# Patient Record
Sex: Female | Born: 1985 | Race: Black or African American | Hispanic: No | Marital: Single | Smoking: Current every day smoker
Health system: Southern US, Community
[De-identification: ages and names within clinical notes are randomized; demographics above are authoritative.]

## PROBLEM LIST (undated history)

## (undated) DIAGNOSIS — K219 Gastro-esophageal reflux disease without esophagitis: Secondary | ICD-10-CM

## (undated) HISTORY — PX: TONSILLECTOMY: SUR1361

## (undated) HISTORY — DX: Gastro-esophageal reflux disease without esophagitis: K21.9

---

## 2013-10-13 ENCOUNTER — Encounter (HOSPITAL_COMMUNITY): Payer: Self-pay | Admitting: Emergency Medicine

## 2013-10-13 ENCOUNTER — Emergency Department (HOSPITAL_COMMUNITY): Payer: Medicaid Other

## 2013-10-13 ENCOUNTER — Emergency Department (HOSPITAL_COMMUNITY)
Admission: EM | Admit: 2013-10-13 | Discharge: 2013-10-13 | Disposition: A | Discharge status: Home / Self Care | Payer: Medicaid Other | Attending: Emergency Medicine | Admitting: Emergency Medicine

## 2013-10-13 DIAGNOSIS — Y9389 Activity, other specified: Secondary | ICD-10-CM | POA: Insufficient documentation

## 2013-10-13 DIAGNOSIS — Z880 Allergy status to penicillin: Secondary | ICD-10-CM | POA: Insufficient documentation

## 2013-10-13 DIAGNOSIS — F17200 Nicotine dependence, unspecified, uncomplicated: Secondary | ICD-10-CM | POA: Insufficient documentation

## 2013-10-13 DIAGNOSIS — S91109A Unspecified open wound of unspecified toe(s) without damage to nail, initial encounter: Secondary | ICD-10-CM | POA: Insufficient documentation

## 2013-10-13 DIAGNOSIS — Y929 Unspecified place or not applicable: Secondary | ICD-10-CM | POA: Insufficient documentation

## 2013-10-13 DIAGNOSIS — IMO0002 Reserved for concepts with insufficient information to code with codable children: Secondary | ICD-10-CM | POA: Insufficient documentation

## 2013-10-13 DIAGNOSIS — S91209A Unspecified open wound of unspecified toe(s) with damage to nail, initial encounter: Secondary | ICD-10-CM

## 2013-10-13 MED ORDER — HYDROCODONE-ACETAMINOPHEN 5-325 MG PO TABS: 1.0000 | ORAL_TABLET | Freq: Four times a day (QID) | ORAL | Status: DC | PRN

## 2013-10-13 NOTE — Discharge Instructions (Signed)
Nail Avulsion Injury Nail avulsion means that you have lost the whole, or part of a nail. The nail will usually grow back in 2 to 6 months. If your injury damaged the growth center of the nail, the nail may be deformed, split, or not stuck to the nail bed. Sometimes the avulsed nail is stitched back in place. This provides temporary protection to the nail bed until the new nail grows in.  HOME CARE INSTRUCTIONS   Raise (elevate) your injury as much as possible.  Protect the injury and cover it with bandages (dressings) or splints as instructed.  Change dressings as instructed. SEEK MEDICAL CARE IF:   There is increasing pain, redness, or swelling.  You cannot move your fingers or toes. Document Released: 07/28/2004 Document Revised: 09/12/2011 Document Reviewed: 05/22/2009 [employer] Patient Information 2014 [employer].  

## 2013-10-13 NOTE — ED Provider Notes (Signed)
CSN: [id number]     Arrival date & time 10/13/13  1846 History  This chart was scribed for Felicie Morn, NP working with Glynn Octave, MD by Quintella Reichert, ED Scribe. This patient was seen in room TR11C/TR11C and the patient's care was started at 8:32 PM.   Chief Complaint  Patient presents with  . Toe Pain    Patient is a 28 y.o. female presenting with toe pain. The history is provided by the patient. No language interpreter was used.  Toe Pain This is a new problem. The current episode started 2 days ago. Episode frequency: Occurred one time. The problem has been gradually worsening. She has tried nothing for the symptoms.    HPI Comments: Donna Cook is a 28 y.o. female who presents to the Emergency Department complaining of a right great toe injury sustained 2 days ago.  Pt states she struck the toe on a bed.  Since then she has had moderate pain to the tip of that toe.  Pain is worsened by bending the toe.  She states her pain has worsened and her nail has come loose.  She reports she is unable to put on a shoe.  Pt denies h/o DM.   History reviewed. No pertinent past medical history.  History reviewed. No pertinent past surgical history.  No family history on file.   History  Substance Use Topics  . Smoking status: Current Every Day Smoker  . Smokeless tobacco: Not on file  . Alcohol Use: Yes    OB History   Grav Para Term Preterm Abortions TAB SAB Ect Mult Living                   Review of Systems  Musculoskeletal: Positive for arthralgias (right great toe).  All other systems reviewed and are negative.     Allergies  Amoxicillin  Home Medications  No current outpatient prescriptions on file.  BP 115/70  Pulse 81  Temp(Src) 98.2 F (36.8 C) (Oral)  Resp 16  Ht 5\' 9"  (1.753 m)  Wt 248 lb 14.4 oz (112.9 kg)  BMI 36.74 kg/m2  SpO2 100%  LMP 09/22/2013  Physical Exam  Nursing note and vitals reviewed. Constitutional: She is oriented to  person, place, and time. She appears well-developed and well-nourished. No distress.  HENT:  Head: Normocephalic and atraumatic.  Eyes: EOM are normal.  Neck: Neck supple. No tracheal deviation present.  Cardiovascular: Normal rate.   Pulmonary/Chest: Effort normal. No respiratory distress.  Musculoskeletal: Normal range of motion.  Right great toe tender to palpation over the distal phalanx.  No erythema noted.  Not hot to the touch.  No subungual hematoma noted.  Neurological: She is alert and oriented to person, place, and time.  Skin: Skin is warm and dry.  Psychiatric: She has a normal mood and affect. Her behavior is normal.    ED Course  Procedures (including critical care time)  DIAGNOSTIC STUDIES: Oxygen Saturation is 100% on room air, normal by my interpretation.    COORDINATION OF CARE: 8:34 PM-Discussed treatment plan which includes imaging with pt at bedside and pt agreed to plan.    Labs Review Labs Reviewed - No data to display  Imaging Review No results found.   EKG Interpretation None      MDM   Final diagnoses:  None    Partial avulsion of nail of great right toe.  No fracture.  No subungual hematoma. No purulent drainage noted.   I personally  performed the services described in this documentation, which was scribed in my presence. The recorded information has been reviewed and is accurate.    [name] John Janayah Zavada, NP 10/14/13 [phone]

## 2013-10-13 NOTE — ED Notes (Signed)
The pt struck her  Rt great toe on the bed 2 days ago.  The nail has come loose and she is having more pain

## 2013-10-14 NOTE — ED Provider Notes (Signed)
Medical screening examination/treatment/procedure(s) were performed by non-physician practitioner and as supervising physician I was immediately available for consultation/collaboration.   EKG Interpretation None       Tarrie Mcmichen, MD 10/14/13 1049 

## 2013-11-05 ENCOUNTER — Encounter (HOSPITAL_COMMUNITY): Payer: Self-pay | Admitting: Emergency Medicine

## 2013-11-05 ENCOUNTER — Emergency Department (HOSPITAL_COMMUNITY)
Admission: EM | Admit: 2013-11-05 | Discharge: 2013-11-06 | Disposition: A | Discharge status: Home / Self Care | Payer: Medicaid Other | Attending: Emergency Medicine | Admitting: Emergency Medicine

## 2013-11-05 DIAGNOSIS — R339 Retention of urine, unspecified: Secondary | ICD-10-CM | POA: Insufficient documentation

## 2013-11-05 NOTE — ED Notes (Signed)
Pt reports lower back pain and unable to urinate- states last was this am; also reports n/v

## 2013-11-06 ENCOUNTER — Encounter (HOSPITAL_COMMUNITY): Payer: Self-pay | Admitting: Emergency Medicine

## 2013-11-06 ENCOUNTER — Emergency Department (INDEPENDENT_AMBULATORY_CARE_PROVIDER_SITE_OTHER)
Admission: EM | Admit: 2013-11-06 | Discharge: 2013-11-06 | Disposition: A | Discharge status: Home / Self Care | Payer: Self-pay | Source: Home / Self Care | Attending: Family Medicine | Admitting: Family Medicine

## 2013-11-06 DIAGNOSIS — N390 Urinary tract infection, site not specified: Secondary | ICD-10-CM

## 2013-11-06 LAB — POCT URINALYSIS DIP (DEVICE)
Bilirubin Urine: NEGATIVE
GLUCOSE, UA: NEGATIVE mg/dL
Ketones, ur: NEGATIVE mg/dL
NITRITE: NEGATIVE
PROTEIN: 30 mg/dL — AB
Specific Gravity, Urine: 1.02 (ref 1.005–1.030)
UROBILINOGEN UA: 0.2 mg/dL (ref 0.0–1.0)
pH: 6 (ref 5.0–8.0)

## 2013-11-06 LAB — POCT PREGNANCY, URINE: PREG TEST UR: NEGATIVE

## 2013-11-06 MED ORDER — NITROFURANTOIN MONOHYD MACRO 100 MG PO CAPS: 100.0000 mg | ORAL_CAPSULE | Freq: Two times a day (BID) | ORAL | Status: DC

## 2013-11-06 NOTE — ED Provider Notes (Signed)
CSN: [id number]     Arrival date & time 11/06/13  1959 History   First MD Initiated Contact with Patient 11/06/13 2022     Chief Complaint  Patient presents with  . Urinary Tract Infection   (Consider location/radiation/quality/duration/timing/severity/associated sxs/prior Treatment) Patient is a 28 y.o. female presenting with dysuria.  Dysuria Pain quality:  Aching Pain severity:  Mild Onset quality:  Gradual Duration:  5 days Timing:  Constant Progression:  Worsening Chronicity:  New Recent urinary tract infections: no   Relieved by:  None tried Associated symptoms: no abdominal pain, no fever, no flank pain, no nausea, no vaginal discharge and no vomiting   Risk factors: no recurrent urinary tract infections     History reviewed. No pertinent past medical history. Past Surgical History  Procedure Laterality Date  . Cesarean section     History reviewed. No pertinent family history. History  Substance Use Topics  . Smoking status: Current Every Day Smoker -- 0.25 packs/day    Types: Cigarettes  . Smokeless tobacco: Not on file  . Alcohol Use: Yes   OB History   Grav Para Term Preterm Abortions TAB SAB Ect Mult Living                 Review of Systems  Constitutional: Negative.  Negative for fever.  Gastrointestinal: Negative for nausea, vomiting and abdominal pain.  Genitourinary: Positive for dysuria, urgency and frequency. Negative for flank pain, vaginal discharge, menstrual problem and pelvic pain.    Allergies  Amoxicillin  Home Medications   Prior to Admission medications   Medication Sig Start Date End Date Taking? Authorizing Provider  HYDROcodone-acetaminophen (NORCO/VICODIN) 5-325 MG per tablet Take 1 tablet by mouth every 6 (six) hours as needed for severe pain. 10/13/13   [name] [name], NP   BP 114/78  Pulse 78  Temp(Src) 98.5 F (36.9 C) (Oral)  Resp 78  SpO2 99%  LMP 11/04/2013 Physical Exam  Nursing note and vitals  reviewed. Constitutional: She is oriented to person, place, and time. She appears well-developed and well-nourished.  Abdominal: Soft. Bowel sounds are normal. She exhibits no distension. There is no tenderness. There is no rebound and no guarding.  Neurological: She is alert and oriented to person, place, and time.  Skin: Skin is warm and dry.    ED Course  Procedures (including critical care time) Labs Review Labs Reviewed  POCT URINALYSIS DIP (DEVICE) - Abnormal; Notable for the following:    Hgb urine dipstick TRACE (*)    Protein, ur 30 (*)    Leukocytes, UA SMALL (*)    All other components within normal limits    Imaging Review No results found.   MDM   1. Acute UTI        Linna HoffJames D Kindl, MD 11/06/13 2050

## 2013-11-06 NOTE — Discharge Instructions (Signed)
Take all of medicine as directed, drink lots of fluids, see your doctor if further problems. °

## 2013-11-06 NOTE — ED Notes (Signed)
Pain low back, low abdominal area x 3-4 days; Depo past due, still sexually active

## 2013-11-06 NOTE — ED Notes (Signed)
Called for pt. Several times and no response.

## 2014-02-26 ENCOUNTER — Encounter (HOSPITAL_COMMUNITY): Payer: Self-pay | Admitting: Emergency Medicine

## 2014-02-26 ENCOUNTER — Emergency Department (HOSPITAL_COMMUNITY)
Admission: EM | Admit: 2014-02-26 | Discharge: 2014-02-26 | Disposition: A | Discharge status: Home / Self Care | Payer: Self-pay | Attending: Emergency Medicine | Admitting: Emergency Medicine

## 2014-02-26 DIAGNOSIS — R220 Localized swelling, mass and lump, head: Secondary | ICD-10-CM | POA: Insufficient documentation

## 2014-02-26 DIAGNOSIS — R221 Localized swelling, mass and lump, neck: Secondary | ICD-10-CM

## 2014-02-26 DIAGNOSIS — K0889 Other specified disorders of teeth and supporting structures: Secondary | ICD-10-CM

## 2014-02-26 DIAGNOSIS — Z79899 Other long term (current) drug therapy: Secondary | ICD-10-CM | POA: Insufficient documentation

## 2014-02-26 DIAGNOSIS — F17200 Nicotine dependence, unspecified, uncomplicated: Secondary | ICD-10-CM | POA: Insufficient documentation

## 2014-02-26 DIAGNOSIS — K089 Disorder of teeth and supporting structures, unspecified: Secondary | ICD-10-CM | POA: Insufficient documentation

## 2014-02-26 MED ORDER — CLINDAMYCIN HCL 150 MG PO CAPS
300.0000 mg | ORAL_CAPSULE | Freq: Once | ORAL | Status: AC
  Administered 2014-02-26: 300 mg via ORAL
  Filled 2014-02-26: qty 2

## 2014-02-26 MED ORDER — HYDROCODONE-ACETAMINOPHEN 5-325 MG PO TABS
2.0000 | ORAL_TABLET | Freq: Once | ORAL | Status: AC
  Administered 2014-02-26: 2 via ORAL
  Filled 2014-02-26: qty 2

## 2014-02-26 MED ORDER — CLINDAMYCIN HCL 150 MG PO CAPS
300.0000 mg | ORAL_CAPSULE | Freq: Three times a day (TID) | ORAL | Status: DC
Start: 2014-02-26 — End: 2014-10-16

## 2014-02-26 MED ORDER — HYDROCODONE-ACETAMINOPHEN 5-325 MG PO TABS: 2.0000 | ORAL_TABLET | ORAL | Status: DC | PRN

## 2014-02-26 NOTE — Discharge Instructions (Signed)
Take Vicodin as needed for pain. Take Clindamycin as directed until gone. Refer to attached documents for more information. Follow up with Dr. Russella Dar for further evaluation.

## 2014-02-26 NOTE — ED Provider Notes (Signed)
CSN: [id number]     Arrival date & time 02/26/14  0713 History   First MD Initiated Contact with Patient 02/26/14 [phone]     Chief Complaint  Patient presents with  . Oral Swelling     (Consider location/radiation/quality/duration/timing/severity/associated sxs/prior Treatment) HPI Comments: The patient is a 28 year old otherwise healthy female who presents with dental pain that started gradually one week ago. The dental pain is severe, constant and progressively worsening. The pain is aching and located in right lower gums. The pain does not radiate. Eating makes the pain worse. Nothing makes the pain better. The patient has tried ibuprofen for pain. No associated symptoms. Patient denies headache, neck pain/stiffness, fever, NVD, edema, sore throat, throat swelling, wheezing, SOB, chest pain, abdominal pain.      History reviewed. No pertinent past medical history. Past Surgical History  Procedure Laterality Date  . Cesarean section     No family history on file. History  Substance Use Topics  . Smoking status: Current Every Day Smoker -- 0.25 packs/day    Types: Cigarettes  . Smokeless tobacco: Not on file  . Alcohol Use: Yes   OB History   Grav Para Term Preterm Abortions TAB SAB Ect Mult Living                 Review of Systems  Constitutional: Negative for fever, chills and fatigue.  HENT: Positive for dental problem. Negative for trouble swallowing.   Eyes: Negative for visual disturbance.  Respiratory: Negative for shortness of breath.   Cardiovascular: Negative for chest pain and palpitations.  Gastrointestinal: Negative for nausea, vomiting, abdominal pain and diarrhea.  Genitourinary: Negative for dysuria and difficulty urinating.  Musculoskeletal: Negative for arthralgias and neck pain.  Skin: Negative for color change.  Neurological: Negative for dizziness and weakness.  Psychiatric/Behavioral: Negative for dysphoric mood.      Allergies   Amoxicillin  Home Medications   Prior to Admission medications   Medication Sig Start Date End Date Taking? Authorizing Provider  HYDROcodone-acetaminophen (NORCO/VICODIN) 5-325 MG per tablet Take 1 tablet by mouth every 6 (six) hours as needed for severe pain. 10/13/13   Jimmye Norman, NP  nitrofurantoin, macrocrystal-monohydrate, (MACROBID) 100 MG capsule Take 1 capsule (100 mg total) by mouth 2 (two) times daily. 11/06/13   [name], MD   BP 135/83  Pulse 99  Temp(Src) 98.2 F (36.8 C) (Oral)  Resp 20  SpO2 100%  LMP 02/12/2014 Physical Exam  Nursing note and vitals reviewed. Constitutional: She is oriented to person, place, and time. She appears well-developed and well-nourished. No distress.  HENT:  Head: Normocephalic and atraumatic.  Mouth/Throat: Oropharynx is clear and moist. No oropharyngeal exudate.  Right lower gingival tenderness to palpation. No tooth tenderness to percussion. No abscess noted.   Eyes: Conjunctivae and EOM are normal.  Neck: Normal range of motion.  Cardiovascular: Normal rate and regular rhythm.  Exam reveals no gallop and no friction rub.   No murmur heard. Pulmonary/Chest: Effort normal and breath sounds normal. She has no wheezes. She has no rales. She exhibits no tenderness.  Musculoskeletal: Normal range of motion.  Lymphadenopathy:    She has no cervical adenopathy.  Neurological: She is alert and oriented to person, place, and time. Coordination normal.  Speech is goal-oriented. Moves limbs without ataxia.   Skin: Skin is warm and dry.  Psychiatric: She has a normal mood and affect. Her behavior is normal.    ED Course  Procedures (including  critical care time) Labs Review Labs Reviewed - No data to display  Imaging Review No results found.   EKG Interpretation None      MDM   Final diagnoses:  Pain, dental    7:46 AM Patient has no signs of ludwigs angina or dental abscess. Patient will have vicodin and  clindamycin here and prescriptions. Vitals stable and patient afebrile.     [name], [place] 02/26/14 [phone]

## 2014-02-26 NOTE — ED Notes (Signed)
28 yo female reports waking up yesterday morning with swelling of the gums on the right lower side of mouth. Denies N/V/D. Pain 10/10.

## 2014-02-26 NOTE — ED Provider Notes (Signed)
Medical screening examination/treatment/procedure(s) were performed by non-physician practitioner and as supervising physician I was immediately available for consultation/collaboration.   EKG Interpretation None        Candyce Churn III, MD 02/26/14 1054

## 2014-07-02 ENCOUNTER — Encounter (HOSPITAL_COMMUNITY): Payer: Self-pay | Admitting: *Deleted

## 2014-07-02 ENCOUNTER — Emergency Department (HOSPITAL_COMMUNITY)
Admission: EM | Admit: 2014-07-02 | Discharge: 2014-07-03 | Disposition: A | Discharge status: Home / Self Care | Payer: Self-pay | Attending: Emergency Medicine | Admitting: Emergency Medicine

## 2014-07-02 DIAGNOSIS — Z3202 Encounter for pregnancy test, result negative: Secondary | ICD-10-CM | POA: Insufficient documentation

## 2014-07-02 DIAGNOSIS — Z880 Allergy status to penicillin: Secondary | ICD-10-CM | POA: Insufficient documentation

## 2014-07-02 DIAGNOSIS — Z720 Tobacco use: Secondary | ICD-10-CM | POA: Insufficient documentation

## 2014-07-02 DIAGNOSIS — R1030 Lower abdominal pain, unspecified: Secondary | ICD-10-CM | POA: Insufficient documentation

## 2014-07-02 DIAGNOSIS — M545 Low back pain: Secondary | ICD-10-CM | POA: Insufficient documentation

## 2014-07-02 DIAGNOSIS — R109 Unspecified abdominal pain: Secondary | ICD-10-CM | POA: Insufficient documentation

## 2014-07-02 DIAGNOSIS — Z9889 Other specified postprocedural states: Secondary | ICD-10-CM | POA: Insufficient documentation

## 2014-07-02 LAB — URINALYSIS, ROUTINE W REFLEX MICROSCOPIC
BILIRUBIN URINE: NEGATIVE
Glucose, UA: NEGATIVE mg/dL
Hgb urine dipstick: NEGATIVE
KETONES UR: NEGATIVE mg/dL
LEUKOCYTES UA: NEGATIVE
NITRITE: NEGATIVE
PH: 6 (ref 5.0–8.0)
PROTEIN: NEGATIVE mg/dL
Specific Gravity, Urine: 1.03 (ref 1.005–1.030)
Urobilinogen, UA: 0.2 mg/dL (ref 0.0–1.0)

## 2014-07-02 LAB — WET PREP, GENITAL
TRICH WET PREP: NONE SEEN
Yeast Wet Prep HPF POC: NONE SEEN

## 2014-07-02 LAB — POC URINE PREG, ED: Preg Test, Ur: NEGATIVE

## 2014-07-02 MED ORDER — HYDROCODONE-ACETAMINOPHEN 5-325 MG PO TABS
1.0000 | ORAL_TABLET | Freq: Once | ORAL | Status: AC
  Administered 2014-07-02: 1 via ORAL
  Filled 2014-07-02: qty 1

## 2014-07-02 MED ORDER — NAPROXEN 500 MG PO TABS: 500.0000 mg | ORAL_TABLET | Freq: Two times a day (BID) | ORAL | Status: DC

## 2014-07-02 MED ORDER — KETOROLAC TROMETHAMINE 60 MG/2ML IM SOLN
60.0000 mg | Freq: Once | INTRAMUSCULAR | Status: AC
  Administered 2014-07-02: 60 mg via INTRAMUSCULAR
  Filled 2014-07-02: qty 2

## 2014-07-02 MED ORDER — METRONIDAZOLE 500 MG PO TABS: 500.0000 mg | ORAL_TABLET | Freq: Two times a day (BID) | ORAL | Status: DC

## 2014-07-02 MED ORDER — ONDANSETRON 4 MG PO TBDP
4.0000 mg | ORAL_TABLET | Freq: Once | ORAL | Status: AC
  Administered 2014-07-02: 4 mg via ORAL
  Filled 2014-07-02: qty 1

## 2014-07-02 NOTE — ED Notes (Signed)
Pelvic at the bedside.

## 2014-07-02 NOTE — ED Notes (Signed)
PA at the bedside.

## 2014-07-02 NOTE — ED Provider Notes (Signed)
CSN: [id number]     Arrival date & time 07/02/14  1844 History   First MD Initiated Contact with Patient 07/02/14 2057     Chief Complaint  Patient presents with  . Pain    (Consider location/radiation/quality/duration/timing/severity/associated sxs/prior Treatment) HPI Donna Cook is a 28 yo female presenting with report of left flank pain x 2 days.  She reports she was sitting down when she noticed the pain in her left flank and back.  The pain is described as constant and rates it as 8/10.  The pain is worsened with movement and coughing. Her LMP was the end of November and is due soon.  She denies any recent injury, fevers, chills, nausea, vomiting, diarrhea or dysuria.   History reviewed. No pertinent past medical history. Past Surgical History  Procedure Laterality Date  . Cesarean section     History reviewed. No pertinent family history. History  Substance Use Topics  . Smoking status: Current Every Day Smoker -- 0.25 packs/day    Types: Cigarettes  . Smokeless tobacco: Not on file  . Alcohol Use: Yes   OB History    No data available     Review of Systems  Constitutional: Negative for fever and chills.  HENT: Negative for sore throat.   Eyes: Negative for visual disturbance.  Respiratory: Negative for cough and shortness of breath.   Cardiovascular: Negative for chest pain and leg swelling.  Gastrointestinal: Negative for nausea, vomiting and diarrhea.  Genitourinary: Positive for flank pain. Negative for dysuria.  Musculoskeletal: Negative for myalgias.  Skin: Negative for rash.  Neurological: Negative for weakness, numbness and headaches.      Allergies  Amoxicillin  Home Medications   Prior to Admission medications   Medication Sig Start Date End Date Taking? Authorizing Provider  clindamycin (CLEOCIN) 150 MG capsule Take 2 capsules (300 mg total) by mouth 3 (three) times daily. May dispense as 150mg  capsules Patient not taking: Reported on  07/02/2014 02/26/14   [name], PA-C  HYDROcodone-acetaminophen (NORCO/VICODIN) 5-325 MG per tablet Take 1 tablet by mouth every 6 (six) hours as needed for severe pain. Patient not taking: Reported on 07/02/2014 10/13/13   [name] [name], NP  HYDROcodone-acetaminophen (NORCO/VICODIN) 5-325 MG per tablet Take 2 tablets by mouth every 4 (four) hours as needed for moderate pain or severe pain. Patient not taking: Reported on 07/02/2014 02/26/14   [name], PA-C  nitrofurantoin, macrocrystal-monohydrate, (MACROBID) 100 MG capsule Take 1 capsule (100 mg total) by mouth 2 (two) times daily. Patient not taking: Reported on 07/02/2014 11/06/13   Linna HoffJames D Kindl, MD   BP 139/88 mmHg  Pulse 100  Temp(Src) 98.5 F (36.9 C) (Oral)  Resp 18  SpO2 100%  LMP 05/29/2014 Physical Exam  Constitutional: She appears well-developed and well-nourished. No distress.  HENT:  Head: Normocephalic and atraumatic.  Mouth/Throat: Oropharynx is clear and moist. No oropharyngeal exudate.  Eyes: Conjunctivae are normal.  Neck: Neck supple. No thyromegaly present.  Cardiovascular: Normal rate, regular rhythm and intact distal pulses.   Pulmonary/Chest: Effort normal and breath sounds normal. No respiratory distress. She has no wheezes. She has no rales. She exhibits no tenderness.  Abdominal: Soft. There is tenderness in the suprapubic area. There is CVA tenderness. There is no rigidity, no rebound, no guarding, no tenderness at McBurney's point and negative Murphy's sign.    Genitourinary: Cervix exhibits no motion tenderness. Right adnexum displays tenderness.  Musculoskeletal:       Lumbar back: She exhibits tenderness.  Back:  Lymphadenopathy:    She has no cervical adenopathy.  Neurological: She is alert.  Skin: Skin is warm and dry. No rash noted. She is not diaphoretic.  Psychiatric: She has a normal mood and affect.  Nursing note and vitals reviewed.   ED Course  Procedures  (including critical care time) Labs Review Labs Reviewed  WET PREP, GENITAL - Abnormal; Notable for the following:    Clue Cells Wet Prep HPF POC MANY (*)    WBC, Wet Prep HPF POC MODERATE (*)    All other components within normal limits  GC/CHLAMYDIA PROBE AMP  URINALYSIS, ROUTINE W REFLEX MICROSCOPIC  POC URINE PREG, ED    Imaging Review Ct Renal Stone Study  07/03/2014   CLINICAL DATA:  LEFT flank pain for 2 days, RIGHT lower quadrant pain. No hematuria.  EXAM: CT ABDOMEN AND PELVIS WITHOUT CONTRAST  TECHNIQUE: Multidetector CT imaging of the abdomen and pelvis was performed following the standard protocol without IV contrast.  COMPARISON:  None.  FINDINGS: LUNG BASES: Included view of the lung bases are clear. The visualized heart and pericardium are unremarkable.  KIDNEYS/BLADDER: Kidneys are orthotopic, demonstrating normal size and morphology. No nephrolithiasis, hydronephrosis; limited assessment for renal masses on this nonenhanced examination. The unopacified ureters are normal in course and caliber. Urinary bladder is partially distended and unremarkable.  SOLID ORGANS: The liver, spleen, gallbladder, pancreas and adrenal glands are unremarkable for this non-contrast examination.  GASTROINTESTINAL TRACT: The stomach, small and large bowel are normal in course and caliber without inflammatory changes, the sensitivity may be decreased by lack of enteric contrast. Mild amount of retained large bowel stool. Small amount of small bowel feces suggests chronic stasis. The appendix is not discretely identified, however there are no inflammatory changes in the right lower quadrant.  PERITONEUM/RETROPERITONEUM: No intraperitoneal free fluid nor free air. Aortoiliac vessels are normal in course and caliber. No lymphadenopathy by CT size criteria. Internal reproductive organs are unremarkable.  SOFT TISSUES/ OSSEOUS STRUCTURES: LEFT gluteal subcutaneous fat stranding may reflect hematoma without  subcutaneous gas or radiopaque foreign bodies. Small fat containing umbilical hernia. Osseous structure unremarkable.  IMPRESSION: No urolithiasis nor acute intra-abdominal/ pelvic process.  Mild amount of retained large bowel stool without bowel obstruction.   Electronically Signed   By: Awilda Metroourtnay  Bloomer   On: 07/03/2014 00:40     EKG Interpretation None      MDM   Final diagnoses:  Flank pain   28 yo with left reproducible flank pain, most likely musculoskeletal due to no dysuria or vaginal discharge, however on  Abdominal exam she has TTP to RLQ.  She has no abd pain without palpation. A pelvic exam was done with no CMT but she does have right adnexal tenderness on palpation only.  She reports she had a female exam the week earlier at her PCP and had the same tenderness to palpation and reports a history of of right ovarian cysts.  Discussed case with Dr. Micheline Mazeocherty. Stone study CT done with no acute abnormality noted.  Pt's wet prep was positive for many clue cells so well treat for BV.  Pt's pain was well-managed in the ED.  Pt is well-appearing, in no acute distress and vital signs are stable.  They appear safe to be discharged.  Discharge include follow-up with their PCP.  Return precautions provided.      Filed Vitals:   07/02/14 2345 07/03/14 0030 07/03/14 0045 07/03/14 0100  BP: 118/68 120/64 126/75 129/106  Pulse:  59 68 67 68  Temp:      TempSrc:      Resp:  17    SpO2: 97% 99% 100% 100%   Meds given in ED:  Medications  HYDROcodone-acetaminophen (NORCO/VICODIN) 5-325 MG per tablet 1 tablet (1 tablet Oral Given 07/02/14 2131)  ondansetron (ZOFRAN-ODT) disintegrating tablet 4 mg (4 mg Oral Given 07/02/14 2131)  ketorolac (TORADOL) injection 60 mg (60 mg Intramuscular Given 07/02/14 2323)    Discharge Medication List as of 07/03/2014  1:07 AM    START taking these medications   Details  metroNIDAZOLE (FLAGYL) 500 MG tablet Take 1 tablet (500 mg total) by mouth 2 (two)  times daily., Starting 07/02/2014, Until Discontinued, Print    naproxen (NAPROSYN) 500 MG tablet Take 1 tablet (500 mg total) by mouth 2 (two) times daily., Starting 07/02/2014, Until Discontinued, Print           Harle Battiest, NP 07/04/14 1510  Toy Cookey, MD 07/05/14 1610

## 2014-07-02 NOTE — ED Notes (Signed)
Pelvic cart at BS.  

## 2014-07-02 NOTE — Discharge Instructions (Signed)
Please follow the directions provided.  Be sure to follow-up  with a primary care provider if this pain does not improve.  Please take the Naproxen twice a day for the pain in your back and take the antibiotic, Flagyl for your vaginal infection.  Be sure not to drink alcohol while taking the Flagyl.  Don't hesitate to return for any new, worsening or concerning symptoms.     SEEK IMMEDIATE MEDICAL CARE IF:  Your pain is not controlled with medicine.  You have new or worsening symptoms.  Your pain increases.  You have abdominal pain.  You have shortness of breath.  You have persistent nausea or vomiting.  You have swelling in your abdomen.  You feel faint or pass out.  You have blood in your urine.  You have a fever or persistent symptoms for more than 2-3 days.  You have a fever and your symptoms suddenly get worse.

## 2014-07-02 NOTE — ED Notes (Signed)
Pt reports pain to left side area that radiates into her lower back. Reports pain increases with movement, coughing, breathing, using the restroom. No acute distress noted at triage.

## 2014-07-02 NOTE — ED Notes (Signed)
Pt unable to obtain urine sample at this time 

## 2014-07-03 ENCOUNTER — Encounter (HOSPITAL_COMMUNITY): Payer: Self-pay | Admitting: Radiology

## 2014-07-03 ENCOUNTER — Emergency Department (HOSPITAL_COMMUNITY): Payer: Self-pay

## 2014-07-06 LAB — GC/CHLAMYDIA PROBE AMP
CT Probe RNA: NEGATIVE
GC Probe RNA: NEGATIVE

## 2014-07-24 ENCOUNTER — Emergency Department (HOSPITAL_COMMUNITY)
Admission: EM | Admit: 2014-07-24 | Discharge: 2014-07-24 | Disposition: A | Discharge status: Home / Self Care | Payer: Medicaid Other | Attending: Emergency Medicine | Admitting: Emergency Medicine

## 2014-07-24 ENCOUNTER — Emergency Department (HOSPITAL_COMMUNITY): Payer: Medicaid Other

## 2014-07-24 ENCOUNTER — Encounter (HOSPITAL_COMMUNITY): Payer: Self-pay

## 2014-07-24 DIAGNOSIS — Z3202 Encounter for pregnancy test, result negative: Secondary | ICD-10-CM | POA: Insufficient documentation

## 2014-07-24 DIAGNOSIS — N730 Acute parametritis and pelvic cellulitis: Secondary | ICD-10-CM | POA: Insufficient documentation

## 2014-07-24 DIAGNOSIS — Z880 Allergy status to penicillin: Secondary | ICD-10-CM | POA: Insufficient documentation

## 2014-07-24 DIAGNOSIS — Z9889 Other specified postprocedural states: Secondary | ICD-10-CM | POA: Insufficient documentation

## 2014-07-24 DIAGNOSIS — Z720 Tobacco use: Secondary | ICD-10-CM | POA: Insufficient documentation

## 2014-07-24 LAB — CBC WITH DIFFERENTIAL/PLATELET
BASOS PCT: 0 % (ref 0–1)
Basophils Absolute: 0 10*3/uL (ref 0.0–0.1)
EOS ABS: 0.1 10*3/uL (ref 0.0–0.7)
Eosinophils Relative: 1 % (ref 0–5)
HEMATOCRIT: 37.4 % (ref 36.0–46.0)
HEMOGLOBIN: 12.3 g/dL (ref 12.0–15.0)
Lymphocytes Relative: 42 % (ref 12–46)
Lymphs Abs: 1.9 10*3/uL (ref 0.7–4.0)
MCH: 28.2 pg (ref 26.0–34.0)
MCHC: 32.9 g/dL (ref 30.0–36.0)
MCV: 85.8 fL (ref 78.0–100.0)
Monocytes Absolute: 0.5 10*3/uL (ref 0.1–1.0)
Monocytes Relative: 12 % (ref 3–12)
NEUTROS PCT: 45 % (ref 43–77)
Neutro Abs: 2.1 10*3/uL (ref 1.7–7.7)
PLATELETS: 260 10*3/uL (ref 150–400)
RBC: 4.36 MIL/uL (ref 3.87–5.11)
RDW: 13.5 % (ref 11.5–15.5)
WBC: 4.6 10*3/uL (ref 4.0–10.5)

## 2014-07-24 LAB — URINALYSIS, ROUTINE W REFLEX MICROSCOPIC
Bilirubin Urine: NEGATIVE
GLUCOSE, UA: NEGATIVE mg/dL
HGB URINE DIPSTICK: NEGATIVE
KETONES UR: NEGATIVE mg/dL
Nitrite: NEGATIVE
Protein, ur: NEGATIVE mg/dL
SPECIFIC GRAVITY, URINE: 1.031 — AB (ref 1.005–1.030)
Urobilinogen, UA: 1 mg/dL (ref 0.0–1.0)
pH: 6 (ref 5.0–8.0)

## 2014-07-24 LAB — COMPREHENSIVE METABOLIC PANEL
ALBUMIN: 4.1 g/dL (ref 3.5–5.2)
ALT: 15 U/L (ref 0–35)
AST: 23 U/L (ref 0–37)
Alkaline Phosphatase: 73 U/L (ref 39–117)
Anion gap: 9 (ref 5–15)
BUN: 15 mg/dL (ref 6–23)
CALCIUM: 9.5 mg/dL (ref 8.4–10.5)
CO2: 23 mmol/L (ref 19–32)
CREATININE: 0.79 mg/dL (ref 0.50–1.10)
Chloride: 106 mEq/L (ref 96–112)
GFR calc non Af Amer: >90 mL/min (ref >90)
Glucose, Bld: 91 mg/dL (ref 70–99)
POTASSIUM: 3.9 mmol/L (ref 3.5–5.1)
Sodium: 138 mmol/L (ref 135–145)
Total Bilirubin: 0.5 mg/dL (ref 0.3–1.2)
Total Protein: 7.3 g/dL (ref 6.0–8.3)

## 2014-07-24 LAB — POC URINE PREG, ED: PREG TEST UR: NEGATIVE

## 2014-07-24 LAB — WET PREP, GENITAL: TRICH WET PREP: NONE SEEN

## 2014-07-24 LAB — URINE MICROSCOPIC-ADD ON

## 2014-07-24 MED ORDER — AZITHROMYCIN 250 MG PO TABS
1000.0000 mg | ORAL_TABLET | Freq: Once | ORAL | Status: AC
  Administered 2014-07-24: 1000 mg via ORAL
  Filled 2014-07-24: qty 4

## 2014-07-24 MED ORDER — HYDROCODONE-ACETAMINOPHEN 5-325 MG PO TABS
1.0000 | ORAL_TABLET | Freq: Once | ORAL | Status: AC
  Administered 2014-07-24: 1 via ORAL
  Filled 2014-07-24: qty 1

## 2014-07-24 MED ORDER — METRONIDAZOLE 500 MG PO TABS: 500.0000 mg | ORAL_TABLET | Freq: Two times a day (BID) | ORAL | Status: DC

## 2014-07-24 MED ORDER — CEFTRIAXONE SODIUM 250 MG IJ SOLR
250.0000 mg | INTRAMUSCULAR | Status: DC
  Administered 2014-07-24: 250 mg via INTRAMUSCULAR
  Filled 2014-07-24: qty 250

## 2014-07-24 MED ORDER — HYDROMORPHONE HCL 1 MG/ML IJ SOLN
1.0000 mg | Freq: Once | INTRAMUSCULAR | Status: DC
  Filled 2014-07-24: qty 1

## 2014-07-24 MED ORDER — LIDOCAINE HCL (PF) 1 % IJ SOLN
2.0000 mL | Freq: Once | INTRAMUSCULAR | Status: AC
  Administered 2014-07-24: 2 mL via INTRADERMAL
  Filled 2014-07-24: qty 5

## 2014-07-24 NOTE — ED Provider Notes (Signed)
CSN: [id number]     Arrival date & time 07/24/14  1639 History   First MD Initiated Contact with Patient 07/24/14 1802     Chief Complaint  Patient presents with  . Abdominal Pain     (Consider location/radiation/quality/duration/timing/severity/associated sxs/prior Treatment) HPI Present health the 29 year old female presents to ED for persistent abdominal pain for the past 2 months. Patient states she was seen in ED on New Year's Eve where she had a CT which was negative and she was discharged. Patient states since that time she has had intermittent pain which is  predominantly right upper quadrant and at times in the epigastric and left upper quadrants. She denies any fever, chills, nausea, vomiting, diarrhea, urinary symptoms. She has been taking Aleve which helps mildly. No VB. Small vag d/c. She does have a history of C-section in the past but is not having any lower abdominal pain and is having normal bowel movements and flatus. She states today pain was refractory to Aleve and at that time decided to come to the ED for further evaluation.   History reviewed. No pertinent past medical history. Past Surgical History  Procedure Laterality Date  . Cesarean section     No family history on file. History  Substance Use Topics  . Smoking status: Current Every Day Smoker -- 0.25 packs/day    Types: Cigarettes  . Smokeless tobacco: Not on file  . Alcohol Use: Yes   OB History    Gravida Para Term Preterm AB TAB SAB Ectopic Multiple Living   2    1     1      Review of Systems  Constitutional: Negative for fever and chills.  HENT: Negative for congestion, rhinorrhea and sore throat.   Eyes: Negative for visual disturbance.  Respiratory: Negative for cough and shortness of breath.   Cardiovascular: Negative for chest pain, palpitations and leg swelling.  Gastrointestinal: Positive for abdominal pain. Negative for nausea, vomiting, diarrhea and constipation.  Genitourinary: Negative  for dysuria, hematuria, vaginal bleeding and vaginal discharge.  Musculoskeletal: Negative for back pain and neck pain.  Skin: Negative for rash.  Neurological: Negative for weakness and headaches.  All other systems reviewed and are negative.     Allergies  Amoxicillin  Home Medications   Prior to Admission medications   Medication Sig Start Date End Date Taking? Authorizing Provider  clindamycin (CLEOCIN) 150 MG capsule Take 2 capsules (300 mg total) by mouth 3 (three) times daily. May dispense as 150mg  capsules Patient not taking: Reported on 07/02/2014 02/26/14   [name], PA-C  HYDROcodone-acetaminophen (NORCO/VICODIN) 5-325 MG per tablet Take 1 tablet by mouth every 6 (six) hours as needed for severe pain. Patient not taking: Reported on 07/02/2014 10/13/13   Donna Normanavid [name], NP  HYDROcodone-acetaminophen (NORCO/VICODIN) 5-325 MG per tablet Take 2 tablets by mouth every 4 (four) hours as needed for moderate pain or severe pain. Patient not taking: Reported on 07/02/2014 02/26/14   [name], PA-C  medroxyPROGESTERone (DEPO-PROVERA) 150 MG/ML injection Inject 150 mg into the muscle every 3 (three) months.    Historical Provider, MD  metroNIDAZOLE (FLAGYL) 500 MG tablet Take 1 tablet (500 mg total) by mouth 2 (two) times daily. Patient not taking: Reported on 07/24/2014 07/02/14   Donna BattiestElizabeth Tysinger, NP  naproxen (NAPROSYN) 500 MG tablet Take 1 tablet (500 mg total) by mouth 2 (two) times daily. Patient not taking: Reported on 07/24/2014 07/02/14   Donna BattiestElizabeth Tysinger, NP  nitrofurantoin, macrocrystal-monohydrate, (MACROBID) 100 MG capsule  Take 1 capsule (100 mg total) by mouth 2 (two) times daily. Patient not taking: Reported on 07/02/2014 11/06/13   Donna Hoff, MD   BP 140/78 mmHg  Pulse 110  Temp(Src) 98.1 F (36.7 C) (Oral)  Resp 20  SpO2 100%  LMP 05/29/2014 (Exact Date) Physical Exam  Constitutional: She is oriented to person, place, and time. She appears  well-developed and well-nourished. No distress.  HENT:  Head: Normocephalic and atraumatic.  Eyes: Conjunctivae are normal.  Neck: Normal range of motion.  Cardiovascular: Normal rate, regular rhythm, normal heart sounds and intact distal pulses.   No murmur heard. Pulmonary/Chest: Effort normal and breath sounds normal. No respiratory distress. She has no wheezes. She has no rales. She exhibits no tenderness.  Abdominal: Soft. Bowel sounds are normal. She exhibits no distension. There is no hepatosplenomegaly. There is tenderness in the right upper quadrant. There is no rigidity, no guarding, no CVA tenderness, no tenderness at McBurney's point and negative Murphy's sign. No hernia.  Genitourinary: Uterus is not tender. Cervix exhibits motion tenderness and discharge. Right adnexum displays no mass, no tenderness and no fullness. Left adnexum displays no mass, no tenderness and no fullness. No tenderness or bleeding in the vagina. Vaginal discharge found.  Musculoskeletal: Normal range of motion.  Neurological: She is alert and oriented to person, place, and time. No cranial nerve deficit.  Skin: Skin is warm and dry.  Psychiatric: She has a normal mood and affect.  Nursing note and vitals reviewed.   ED Course  Procedures (including critical care time) Labs Review Labs Reviewed  WET PREP, GENITAL - Abnormal; Notable for the following:    Yeast Wet Prep HPF POC FEW (*)    Clue Cells Wet Prep HPF POC FEW (*)    WBC, Wet Prep HPF POC MODERATE (*)    All other components within normal limits  URINALYSIS, ROUTINE W REFLEX MICROSCOPIC - Abnormal; Notable for the following:    Specific Gravity, Urine 1.031 (*)    Leukocytes, UA SMALL (*)    All other components within normal limits  URINE MICROSCOPIC-ADD ON - Abnormal; Notable for the following:    Squamous Epithelial / LPF FEW (*)    Bacteria, UA FEW (*)    All other components within normal limits  CBC WITH DIFFERENTIAL  COMPREHENSIVE  METABOLIC PANEL  POC URINE PREG, ED  GC/CHLAMYDIA PROBE AMP ([place])    Imaging Review US Abdomen Complete  07/24/2014   CLINICAL DATA:  Right-sided abdominal pain for 2 days. Initial encounter.  EXAM: ULTRASOUND ABDOMEN COMPLETE  COMPARISON:  CT abdomen and pelvis 07/03/2014.  FINDINGS: Gallbladder: No gallstones or wall thickening visualized. No sonographic Murphy sign noted.  Common bile duct: Diameter: 0.3 cm  Liver: No focal lesion identified. Within normal limits in parenchymal echogenicity.  IVC: No abnormality visualized.  Pancreas: Visualized portion unremarkable.  Spleen: Size and appearance within normal limits.  Right Kidney: Length: 12.1 cm. Echogenicity within normal limits. No mass or hydronephrosis visualized.  Left Kidney: Length: 12.4 cm. Echogenicity within normal limits. No mass or hydronephrosis visualized.  Abdominal aorta: No aneurysm visualized.  Other findings: None.  IMPRESSION: Negative exam.   Electronically Signed   By: Drusilla Kanner M.D.   On: 07/24/2014 19:12     EKG Interpretation None      MDM   Final diagnoses:  None    Allizon Woznick is a 29 y.o. female with H&P as above. HDS, NAD. Mild right upper quadrant TTP  without true Murphy sign. No signs of acute abdomen or peritonitis. Clinical picture concerning for biliary colic although story is not classic. Patient without historical findings to suggest GERD, PUD, pancreatitis. This also could be gastritis. We will obtain a RUQ scan and screening labs. Patient was treated for her symptoms while in the ED. Labs unremarkable, pt not pregnancy. Ultrasound negative for abnormality.  Pelvic exam was notable for cervical motion tenderness and patient had white discharge. Patient reports she has to leave probably at 8 PM and cannot wait further for labs. Patient was treated for PID and given IM Rocephin, azithromycin and prescription for Flagyl. I have advised close PCP follow-up.  Clinical Impression: 1. PID  (acute pelvic inflammatory disease)     Disposition: Discharge  Condition: Good  I have discussed the results, Dx and Tx plan with the pt(& family if present). He/she/they expressed understanding and agree(s) with the plan. Discharge instructions discussed at great length. Strict return precautions discussed and pt &/or family have verbalized understanding of the instructions. No further questions at time of discharge.   Pt seen in conjunction with Dr. Almeta Monas, DO [facility] Emergency Medicine Resident - PGY-2    Ames Dura, MD 07/25/14 [phone]

## 2014-07-24 NOTE — ED Notes (Signed)
Pt reports lower abdominal pain x 2 days. Reports similar episodes in the past with no dx. Denies N/V/D. Reports urinary frequency. Denies fever/chills. NAD. LMP November, currently on Depo shot.

## 2014-07-24 NOTE — Discharge Instructions (Signed)
Pelvic Inflammatory Disease °Pelvic inflammatory disease (PID) refers to an infection in some or all of the female organs. The infection can be in the uterus, ovaries, fallopian tubes, or the surrounding tissues in the pelvis. PID can cause abdominal or pelvic pain that comes on suddenly (acute pelvic pain). PID is a serious infection because it can lead to lasting (chronic) pelvic pain or the inability to have children (infertile).  °CAUSES  °The infection is often caused by the normal bacteria found in the vaginal tissues. PID may also be caused by an infection that is spread during sexual contact. PID can also occur following:  °· The birth of a baby.   °· A miscarriage.   °· An abortion.   °· Major pelvic surgery.   °· The use of an intrauterine device (IUD).   °· A sexual assault.   °RISK FACTORS °Certain factors can put a person at higher risk for PID, such as: °· Being younger than 25 years. °· Being sexually active at a young age. °· Using nonbarrier contraception. °· Having multiple sexual partners. °· Having sex with someone who has symptoms of a genital infection. °· Using oral contraception. °Other times, certain behaviors can increase the possibility of getting PID, such as: °· Having sex during your period. °· Using a vaginal douche. °· Having an intrauterine device (IUD) in place. °SYMPTOMS  °· Abdominal or pelvic pain.   °· Fever.   °· Chills.   °· Abnormal vaginal discharge. °· Abnormal uterine bleeding.   °· Unusual pain shortly after finishing your period. °DIAGNOSIS  °Your caregiver will choose some of the following methods to make a diagnosis, such as:  °· Performing a physical exam and history. A pelvic exam typically reveals a very tender uterus and surrounding pelvis.   °· Ordering laboratory tests including a pregnancy test, blood tests, and urine test.  °· Ordering cultures of the vagina and cervix to check for a sexually transmitted infection (STI). °· Performing an ultrasound.    °· Performing a laparoscopic procedure to look inside the pelvis.   °TREATMENT  °· Antibiotic medicines may be prescribed and taken by mouth.   °· Sexual partners may be treated when the infection is caused by a sexually transmitted disease (STD).   °· Hospitalization may be needed to give antibiotics intravenously. °· Surgery may be needed, but this is rare. °It may take weeks until you are completely well. If you are diagnosed with PID, you should also be checked for human immunodeficiency virus (HIV).   °HOME CARE INSTRUCTIONS  °· If given, take your antibiotics as directed. Finish the medicine even if you start to feel better.   °· Only take over-the-counter or prescription medicines for pain, discomfort, or fever as directed by your caregiver.   °· Do not have sexual intercourse until treatment is completed or as directed by your caregiver. If PID is confirmed, your recent sexual partner(s) will need treatment.   °· Keep your follow-up appointments. °SEEK MEDICAL CARE IF:  °· You have increased or abnormal vaginal discharge.   °· You need prescription medicine for your pain.   °· You vomit.   °· You cannot take your medicines.   °· Your partner has an STD.   °SEEK IMMEDIATE MEDICAL CARE IF:  °· You have a fever.   °· You have increased abdominal or pelvic pain.   °· You have chills.   °· You have pain when you urinate.   °· You are not better after 72 hours following treatment.   °MAKE SURE YOU:  °· Understand these instructions. °· Will watch your condition. °· Will get help right away if you are not doing well or get worse. °  Document Released: 06/20/2005 Document Revised: 10/15/2012 Document Reviewed: 06/16/2011 °[employer]® Patient Information ©2015 [employer]. This information is not intended to replace advice given to you by your health care provider. Make sure you discuss any questions you have with your health care provider. ° °

## 2014-07-24 NOTE — ED Provider Notes (Signed)
I saw and evaluated the patient, reviewed the resident's note and I agree with the findings and plan.   EKG Interpretation None      Pt is a 29 y.o. female who presents emergency department with abdominal pain for the past several weeks. Was seen in the emergency department 07/03/14 for the same and had a negative noncontrast CT scan. Reports abdominal pain is in the right upper quadrant and diffuse lower abdomen. On exam, patient has no peritoneal signs, is afebrile, nontoxic-appearing, well-hydrated and hemodynamically stable.  Labs unremarkable. Right upper quadrant ultrasound shows no acute abnormality. Urine shows small leukocytes but also a few bacteria and squamous cells. Suspect her to catch. Urine pregnancy negative. Pelvic exam reveals cervical motion tenderness. No significant adnexal tenderness. We'll treat for PID. Will give outpatient follow-up information.  Layla MawKristen N Ward, DO 07/24/14 2335

## 2014-07-24 NOTE — ED Notes (Signed)
Pt was seen her approx 1 month ago, had a CT scan.  Sts she was told "you have a cyst" but she cannot recall where.

## 2014-07-25 LAB — GC/CHLAMYDIA PROBE AMP (~~LOC~~) NOT AT ARMC
CHLAMYDIA, DNA PROBE: NEGATIVE
NEISSERIA GONORRHEA: NEGATIVE

## 2014-10-16 ENCOUNTER — Emergency Department (HOSPITAL_COMMUNITY)
Admission: EM | Admit: 2014-10-16 | Discharge: 2014-10-16 | Discharge status: Against Medical Advice | Payer: Self-pay | Attending: Emergency Medicine | Admitting: Emergency Medicine

## 2014-10-16 ENCOUNTER — Encounter (HOSPITAL_COMMUNITY): Payer: Self-pay | Admitting: Emergency Medicine

## 2014-10-16 ENCOUNTER — Emergency Department (HOSPITAL_COMMUNITY): Payer: Medicaid Other

## 2014-10-16 DIAGNOSIS — Z7951 Long term (current) use of inhaled steroids: Secondary | ICD-10-CM | POA: Insufficient documentation

## 2014-10-16 DIAGNOSIS — Z720 Tobacco use: Secondary | ICD-10-CM | POA: Insufficient documentation

## 2014-10-16 DIAGNOSIS — J0100 Acute maxillary sinusitis, unspecified: Secondary | ICD-10-CM | POA: Insufficient documentation

## 2014-10-16 DIAGNOSIS — Z880 Allergy status to penicillin: Secondary | ICD-10-CM | POA: Insufficient documentation

## 2014-10-16 LAB — HEPATIC FUNCTION PANEL
ALK PHOS: 63 U/L (ref 39–117)
ALT: 15 U/L (ref 0–35)
AST: 17 U/L (ref 0–37)
Albumin: 3.8 g/dL (ref 3.5–5.2)
Total Bilirubin: 0.3 mg/dL (ref 0.3–1.2)
Total Protein: 7.3 g/dL (ref 6.0–8.3)

## 2014-10-16 LAB — BASIC METABOLIC PANEL
Anion gap: 11 (ref 5–15)
BUN: 8 mg/dL (ref 6–23)
CALCIUM: 9.1 mg/dL (ref 8.4–10.5)
CO2: 23 mmol/L (ref 19–32)
CREATININE: 0.86 mg/dL (ref 0.50–1.10)
Chloride: 103 mmol/L (ref 96–112)
GFR calc Af Amer: >90 mL/min (ref >90)
GFR calc non Af Amer: >90 mL/min (ref >90)
Glucose, Bld: 87 mg/dL (ref 70–99)
Potassium: 3.8 mmol/L (ref 3.5–5.1)
SODIUM: 137 mmol/L (ref 135–145)

## 2014-10-16 LAB — I-STAT CG4 LACTIC ACID, ED: LACTIC ACID, VENOUS: 0.67 mmol/L (ref 0.5–2.0)

## 2014-10-16 LAB — CBC WITH DIFFERENTIAL/PLATELET
BASOS ABS: 0 10*3/uL (ref 0.0–0.1)
Basophils Relative: 0 % (ref 0–1)
EOS PCT: 0 % (ref 0–5)
Eosinophils Absolute: 0 10*3/uL (ref 0.0–0.7)
HEMATOCRIT: 35.2 % — AB (ref 36.0–46.0)
HEMOGLOBIN: 11.4 g/dL — AB (ref 12.0–15.0)
Lymphocytes Relative: 19 % (ref 12–46)
Lymphs Abs: 1 10*3/uL (ref 0.7–4.0)
MCH: 27.6 pg (ref 26.0–34.0)
MCHC: 32.4 g/dL (ref 30.0–36.0)
MCV: 85.2 fL (ref 78.0–100.0)
MONO ABS: 0.8 10*3/uL (ref 0.1–1.0)
Monocytes Relative: 16 % — ABNORMAL HIGH (ref 3–12)
Neutro Abs: 3.2 10*3/uL (ref 1.7–7.7)
Neutrophils Relative %: 65 % (ref 43–77)
Platelets: 244 10*3/uL (ref 150–400)
RBC: 4.13 MIL/uL (ref 3.87–5.11)
RDW: 13.7 % (ref 11.5–15.5)
WBC: 5 10*3/uL (ref 4.0–10.5)

## 2014-10-16 LAB — SALICYLATE LEVEL: Salicylate Lvl: <4 mg/dL (ref 2.8–20.0)

## 2014-10-16 LAB — I-STAT CHEM 8, ED
BUN: 7 mg/dL (ref 6–23)
CALCIUM ION: 1.17 mmol/L (ref 1.12–1.23)
CHLORIDE: 102 mmol/L (ref 96–112)
CREATININE: 0.8 mg/dL (ref 0.50–1.10)
GLUCOSE: 96 mg/dL (ref 70–99)
HCT: 38 % (ref 36.0–46.0)
Hemoglobin: 12.9 g/dL (ref 12.0–15.0)
Potassium: 3.8 mmol/L (ref 3.5–5.1)
Sodium: 137 mmol/L (ref 135–145)
TCO2: 21 mmol/L (ref 0–100)

## 2014-10-16 LAB — ACETAMINOPHEN LEVEL: Acetaminophen (Tylenol), Serum: <10 ug/mL — ABNORMAL LOW (ref 10–30)

## 2014-10-16 MED ORDER — PROCHLORPERAZINE EDISYLATE 5 MG/ML IJ SOLN
10.0000 mg | Freq: Once | INTRAMUSCULAR | Status: AC
  Administered 2014-10-16: 10 mg via INTRAVENOUS
  Filled 2014-10-16: qty 2

## 2014-10-16 MED ORDER — MAGNESIUM SULFATE 2 GM/50ML IV SOLN
2.0000 g | Freq: Once | INTRAVENOUS | Status: AC
  Administered 2014-10-16: 2 g via INTRAVENOUS
  Filled 2014-10-16: qty 50

## 2014-10-16 MED ORDER — METHYLPREDNISOLONE SODIUM SUCC 125 MG IJ SOLR
125.0000 mg | Freq: Once | INTRAMUSCULAR | Status: AC
  Administered 2014-10-16: 125 mg via INTRAVENOUS
  Filled 2014-10-16: qty 2

## 2014-10-16 MED ORDER — SODIUM CHLORIDE 0.9 % IV BOLUS (SEPSIS)
1000.0000 mL | Freq: Once | INTRAVENOUS | Status: AC
  Administered 2014-10-16: 1000 mL via INTRAVENOUS

## 2014-10-16 MED ORDER — DIPHENHYDRAMINE HCL 50 MG/ML IJ SOLN
25.0000 mg | Freq: Once | INTRAMUSCULAR | Status: AC
  Administered 2014-10-16: 25 mg via INTRAVENOUS
  Filled 2014-10-16: qty 1

## 2014-10-16 MED ORDER — FLUTICASONE PROPIONATE 50 MCG/ACT NA SUSP: 2.0000 | Freq: Every day | NASAL | Status: DC

## 2014-10-16 MED ORDER — BUTALBITAL-APAP-CAFFEINE 50-325-40 MG PO TABS: 1.0000 | ORAL_TABLET | Freq: Four times a day (QID) | ORAL | Status: DC | PRN

## 2014-10-16 MED ORDER — TETRACAINE HCL 0.5 % OP SOLN
2.0000 [drp] | Freq: Once | OPHTHALMIC | Status: AC
  Administered 2014-10-16: 2 [drp] via OPHTHALMIC
  Filled 2014-10-16: qty 2

## 2014-10-16 NOTE — Discharge Instructions (Signed)
Use nasal saline (you can try [employer] Simply Saline) at least 4 times a day, use saline 5-10 minutes before using the fluticasone (flonase) nasal spray  Do not use Afrin (Oxymetazoline)  Rest, wash hands frequently  and drink plenty of water.  You may try counter medication such as Mucinex or Sudafed decongestant.  Please follow with your primary care doctor in the next 2 days for a check-up. They must obtain records for further management.   Do not hesitate to return to the Emergency Department for any new, worsening or concerning symptoms.    Sinusitis Sinusitis is redness, soreness, and inflammation of the paranasal sinuses. Paranasal sinuses are air pockets within the bones of your face (beneath the eyes, the middle of the forehead, or above the eyes). In healthy paranasal sinuses, mucus is able to drain out, and air is able to circulate through them by way of your nose. However, when your paranasal sinuses are inflamed, mucus and air can become trapped. This can allow bacteria and other germs to grow and cause infection. Sinusitis can develop quickly and last only a short time (acute) or continue over a long period (chronic). Sinusitis that lasts for more than 12 weeks is considered chronic.  CAUSES  Causes of sinusitis include:  Allergies.  Structural abnormalities, such as displacement of the cartilage that separates your nostrils (deviated septum), which can decrease the air flow through your nose and sinuses and affect sinus drainage.  Functional abnormalities, such as when the small hairs (cilia) that line your sinuses and help remove mucus do not work properly or are not present. SIGNS AND SYMPTOMS  Symptoms of acute and chronic sinusitis are the same. The primary symptoms are pain and pressure around the affected sinuses. Other symptoms include:  Upper toothache.  Earache.  Headache.  Bad breath.  Decreased sense of smell and taste.  A cough, which worsens when  you are lying flat.  Fatigue.  Fever.  Thick drainage from your nose, which often is green and may contain pus (purulent).  Swelling and warmth over the affected sinuses. DIAGNOSIS  Your health care provider will perform a physical exam. During the exam, your health care provider may:  Look in your nose for signs of abnormal growths in your nostrils (nasal polyps).  Tap over the affected sinus to check for signs of infection.  View the inside of your sinuses (endoscopy) using an imaging device that has a light attached (endoscope). If your health care provider suspects that you have chronic sinusitis, one or more of the following tests may be recommended:  Allergy tests.  Nasal culture. A sample of mucus is taken from your nose, sent to a lab, and screened for bacteria.  Nasal cytology. A sample of mucus is taken from your nose and examined by your health care provider to determine if your sinusitis is related to an allergy. TREATMENT  Most cases of acute sinusitis are related to a viral infection and will resolve on their own within 10 days. Sometimes medicines are prescribed to help relieve symptoms (pain medicine, decongestants, nasal steroid sprays, or saline sprays).  However, for sinusitis related to a bacterial infection, your health care provider will prescribe antibiotic medicines. These are medicines that will help kill the bacteria causing the infection.  Rarely, sinusitis is caused by a fungal infection. In theses cases, your health care provider will prescribe antifungal medicine. For some cases of chronic sinusitis, surgery is needed. Generally, these are cases in which sinusitis  recurs more than 3 times per year, despite other treatments. HOME CARE INSTRUCTIONS   Drink plenty of water. Water helps thin the mucus so your sinuses can drain more easily.  Use a humidifier.  Inhale steam 3 to 4 times a day (for example, sit in the bathroom with the shower  running).  Apply a warm, moist washcloth to your face 3 to 4 times a day, or as directed by your health care provider.  Use saline nasal sprays to help moisten and clean your sinuses.  Take medicines only as directed by your health care provider.  If you were prescribed either an antibiotic or antifungal medicine, finish it all even if you start to feel better. SEEK IMMEDIATE MEDICAL CARE IF:  You have increasing pain or severe headaches.  You have nausea, vomiting, or drowsiness.  You have swelling around your face.  You have vision problems.  You have a stiff neck.  You have difficulty breathing. MAKE SURE YOU:   Understand these instructions.  Will watch your condition.  Will get help right away if you are not doing well or get worse. Document Released: 06/20/2005 Document Revised: 11/04/2013 Document Reviewed: 07/05/2011 [facility] Patient Information 2015 [place], [place]. This information is not intended to replace advice given to you by your health care provider. Make sure you discuss any questions you have with your health care provider.

## 2014-10-16 NOTE — ED Provider Notes (Signed)
CSN: [id number]     Arrival date & time 10/16/14  5621 History   First MD Initiated Contact with Patient 10/16/14 [phone]     Chief Complaint  Patient presents with  . Headache    L side behind eye.     (Consider location/radiation/quality/duration/timing/severity/associated sxs/prior Treatment) HPI   Donna Cook is a 29 y.o. female complaining of a severe headache.  She states that it began yesterday during work and progressively got worse.  It began as a 4/10 and now is described as a 10/10 pain.  She says the entire left side of her face feels like someone is stomping on it, but that the most pain is in her left eye.  It feels like someone is stabbing her in the back of the eye.  She also has photophobia, but denies phonophobia.  It hurts to open the left eye and she states she has blurry vision in that eye.  The pain is made worse by yawning or coughing.  She took (20) ibuprofen over the course of yesterday without relief.  She denies difficulty with speech, weakness, neck pain, or any recent changes in activities, no thunderclap onset.  She has no history of headaches.  She states she sees her primary care regularly and has no personal significant medical history, but she has cousins and aunts who have had strokes in their 56's.     History reviewed. No pertinent past medical history. Past Surgical History  Procedure Laterality Date  . Cesarean section     No family history on file. History  Substance Use Topics  . Smoking status: Current Every Day Smoker -- 1.00 packs/day    Types: Cigarettes  . Smokeless tobacco: Not on file  . Alcohol Use: Yes     Comment: socially   OB History    Gravida Para Term Preterm AB TAB SAB Ectopic Multiple Living   Review of Systems  10 systems reviewed and found to be negative, except as noted in the HPI.   Allergies  Amoxicillin  Home Medications   Prior to Admission medications   Medication Sig Start Date End Date  Taking? Authorizing Provider  ibuprofen (ADVIL,MOTRIN) 200 MG tablet Take 600-800 mg by mouth every 6 (six) hours as needed for mild pain or moderate pain.   Yes Historical Provider, MD  medroxyPROGESTERone (DEPO-PROVERA) 150 MG/ML injection Inject 150 mg into the muscle every 3 (three) months.   Yes Historical Provider, MD  mometasone (NASONEX) 50 MCG/ACT nasal spray Place 21 sprays into the nose daily.   Yes Historical Provider, MD  butalbital-acetaminophen-caffeine (FIORICET) 50-325-40 MG per tablet Take 1 tablet by mouth every 6 (six) hours as needed for headache. 10/16/14   Joni Reining Pricilla Moehle, PA-C  fluticasone (FLONASE) 50 MCG/ACT nasal spray Place 2 sprays into both nostrils daily. 10/16/14   Josiah Wojtaszek, PA-C   BP 126/75 mmHg  Pulse 99  Temp(Src) 99.3 F (37.4 C) (Oral)  Resp 18  Ht  (1.753 m)  Wt 248 lb (112.492 kg)  BMI 36.61 kg/m2  SpO2 98% Physical Exam  Constitutional: She is oriented to person, place, and time. She appears well-developed and well-nourished.  Appears acutely uncomfortable  HENT:  Head: Normocephalic and atraumatic.  Mouth/Throat: Oropharynx is clear and moist.  Patient is diffusely especially tender to palpation along the left face. No focal tenderness palpation,  Eyes: Conjunctivae and EOM are normal. Pupils are equal, round,  and reactive to light.  Left eye is tearing, extremely photosensitive  OS 19mmHg; 20/50  OD 20mmHg; 20/50 OA 20/50  EOMI intact, no visual field cut    Neck: Normal range of motion.  Cardiovascular: Normal rate, regular rhythm and intact distal pulses.   Pulmonary/Chest: Effort normal and breath sounds normal.  Abdominal: Soft. There is no tenderness.  Musculoskeletal: Normal range of motion.  Neurological: She is alert and oriented to person, place, and time.  II-Visual fields grossly intact. III/IV/VI-Extraocular movements intact.  Pupils reactive bilaterally. V/VII-Smile symmetric, equal eyebrow raise,  facial  sensation intact VIII- Hearing grossly intact IX/X-Normal gag XI-bilateral shoulder shrug XII-midline tongue extension Motor: 5/5 strength to all extremities except for right grip is 4 out of 5 ( position had an elbow injury and this is her baseline.) with normal tone and bulk Cerebellar: Normal finger-to-nose  and normal heel-to-shin test.   Romberg negative Ambulates with a coordinated gait   Psychiatric: She has a normal mood and affect.  Nursing note and vitals reviewed.   ED Course  Procedures (including critical care time) Labs Review Labs Reviewed  PREGNANCY, URINE  HEPATIC FUNCTION PANEL  ACETAMINOPHEN LEVEL  SALICYLATE LEVEL  BASIC METABOLIC PANEL  CBC WITH DIFFERENTIAL/PLATELET  I-STAT CG4 LACTIC ACID, ED  I-STAT CHEM 8, ED  I-STAT CG4 LACTIC ACID, ED  I-STAT BETA HCG BLOOD, ED ([facility], [facility], [facility] ONLY)    Imaging Review Ct Head Wo Contrast  10/16/2014   CLINICAL DATA:  Headache.  Photophobia.  EXAM: CT HEAD WITHOUT CONTRAST  TECHNIQUE: Contiguous axial images were obtained from the base of the skull through the vertex without intravenous contrast.  COMPARISON:  None.  FINDINGS: There is no acute intracranial hemorrhage, acute infarction, or intracranial mass lesion. Brain parenchyma appears normal. Orbits appear normal. There is a small air-fluid level in the left maxillary sinus with edema of the left superior turbinate.  IMPRESSION: Normal appearing brain. Air-fluid level in the left maxillary sinus which could be acute.   Electronically Signed   By: Francene BoyersJames  Maxwell M.D.   On: 10/16/2014 11:02     EKG Interpretation None      MDM   Final diagnoses:  Acute maxillary sinusitis, recurrence not specified    Filed Vitals:   10/16/14 0840 10/16/14 0901 10/16/14 1100  BP: 127/73  126/75  Pulse: 139  99  Temp: 99.3 F (37.4 C)    TempSrc: Oral    Resp: 18    Height:  5\' 9"  (1.753 m)   Weight:  248 lb (112.492 kg)   SpO2: 100%  98%    Medications  tetracaine  (PONTOCAINE) 0.5 % ophthalmic solution 2 drop (not administered)  prochlorperazine (COMPAZINE) injection 10 mg (10 mg Intravenous Given 10/16/14 1006)  diphenhydrAMINE (BENADRYL) injection 25 mg (25 mg Intravenous Given 10/16/14 1005)  methylPREDNISolone sodium succinate (SOLU-MEDROL) 125 mg/2 mL injection 125 mg (125 mg Intravenous Given 10/16/14 1005)  sodium chloride 0.9 % bolus 1,000 mL (1,000 mLs Intravenous New Bag/Given 10/16/14 1001)  magnesium sulfate IVPB 2 g 50 mL (0 g Intravenous Stopped 10/16/14 1115)    Donna Cook is a pleasant 29 y.o. female presenting with severe left facial pain. This is atypical for the patient. Neuro exam is nonfocal except for a mild weakness to the right grip strength which she said is normal for her baseline secondary to remote elbow injury. This was not a thunderclap onset. Patient is afebrile with no meningismus. Patient took 20 ibuprofen tablets little relief.  Renal function is normal. CT shows a left maxillary sinusitis. Patient reports resolution of headache with headache cocktail. When she is more calm she states that she has had sinus congestion pressure with a little runny nose. Distal started approximately 2 days ago. She is afebrile this is a new onset no antibiotics are indicated. I have instructed her to use Flonase.  Evaluation does not show pathology that would require ongoing emergent intervention or inpatient treatment. Pt is hemodynamically stable and mentating appropriately. Discussed findings and plan with patient/guardian, who agrees with care plan. All questions answered. Return precautions discussed and outpatient follow up given.   New Prescriptions   BUTALBITAL-ACETAMINOPHEN-CAFFEINE (FIORICET) 50-325-40 MG PER TABLET    Take 1 tablet by mouth every 6 (six) hours as needed for headache.   FLUTICASONE (FLONASE) 50 MCG/ACT NASAL SPRAY    Place 2 sprays into both nostrils daily.   Upon discharge patient states that she's taken 500 mg  ibuprofen: I have explained to her the ibuprofen does not come in this dose. She's taken 20 of which she describes as [employer] pain reliever. She's done this in the last 24 hours. This must be acetaminophen. I've asked her to stay in the ED for further workup of acetaminophen overdose. Patient is willing to stay.  Patient has eloped, I will still send her blood work off for testing.      Wynetta Emery, PA-C 10/16/14 1224  Normal salicylate, acetaminophen and liver function tests, patient may have been exaggerating her usage.   Wynetta Emery, PA-C 10/16/14 1422  Glynn Octave, MD 10/16/14 [phone]

## 2014-10-16 NOTE — ED Notes (Addendum)
Patient states headache came on yesterday behind L eye.  Patient states she is having photophobia with intense pain.   Patient states did take ibuprofen at home, but no relief.  Initial neuro WNL.

## 2014-10-16 NOTE — ED Notes (Signed)
RN went to perform EKG on pt and pt/visitor was not in room.  All pt belongings were gone and pt/visitor was not found in restrooms, department or waiting room.  PA made aware.

## 2015-03-29 ENCOUNTER — Emergency Department (HOSPITAL_COMMUNITY)
Admission: EM | Admit: 2015-03-29 | Discharge: 2015-03-30 | Disposition: A | Discharge status: Home / Self Care | Payer: Medicaid Other | Attending: Emergency Medicine | Admitting: Emergency Medicine

## 2015-03-29 ENCOUNTER — Encounter (HOSPITAL_COMMUNITY): Payer: Self-pay

## 2015-03-29 DIAGNOSIS — Z79899 Other long term (current) drug therapy: Secondary | ICD-10-CM | POA: Insufficient documentation

## 2015-03-29 DIAGNOSIS — Z880 Allergy status to penicillin: Secondary | ICD-10-CM | POA: Insufficient documentation

## 2015-03-29 DIAGNOSIS — Z720 Tobacco use: Secondary | ICD-10-CM | POA: Insufficient documentation

## 2015-03-29 DIAGNOSIS — Z3202 Encounter for pregnancy test, result negative: Secondary | ICD-10-CM | POA: Insufficient documentation

## 2015-03-29 DIAGNOSIS — B349 Viral infection, unspecified: Secondary | ICD-10-CM

## 2015-03-29 DIAGNOSIS — R112 Nausea with vomiting, unspecified: Secondary | ICD-10-CM

## 2015-03-29 LAB — COMPREHENSIVE METABOLIC PANEL
ALT: 16 U/L (ref 14–54)
ANION GAP: 11 (ref 5–15)
AST: 23 U/L (ref 15–41)
Albumin: 4.1 g/dL (ref 3.5–5.0)
Alkaline Phosphatase: 75 U/L (ref 38–126)
BUN: 6 mg/dL (ref 6–20)
CALCIUM: 9.8 mg/dL (ref 8.9–10.3)
CO2: 25 mmol/L (ref 22–32)
Chloride: 105 mmol/L (ref 101–111)
Creatinine, Ser: 0.87 mg/dL (ref 0.44–1.00)
GFR calc non Af Amer: >60 mL/min (ref >60)
Glucose, Bld: 108 mg/dL — ABNORMAL HIGH (ref 65–99)
Potassium: 3.7 mmol/L (ref 3.5–5.1)
SODIUM: 141 mmol/L (ref 135–145)
Total Bilirubin: 0.4 mg/dL (ref 0.3–1.2)
Total Protein: 7.5 g/dL (ref 6.5–8.1)

## 2015-03-29 LAB — LIPASE, BLOOD: LIPASE: 15 U/L — AB (ref 22–51)

## 2015-03-29 LAB — CBC
HCT: 41.9 % (ref 36.0–46.0)
HEMOGLOBIN: 13.7 g/dL (ref 12.0–15.0)
MCH: 28 pg (ref 26.0–34.0)
MCHC: 32.7 g/dL (ref 30.0–36.0)
MCV: 85.5 fL (ref 78.0–100.0)
Platelets: 314 10*3/uL (ref 150–400)
RBC: 4.9 MIL/uL (ref 3.87–5.11)
RDW: 13.4 % (ref 11.5–15.5)
WBC: 5.8 10*3/uL (ref 4.0–10.5)

## 2015-03-29 LAB — HCG, QUANTITATIVE, PREGNANCY: HCG, BETA CHAIN, QUANT, S: 1 m[IU]/mL (ref <5)

## 2015-03-29 MED ORDER — ONDANSETRON HCL 4 MG/2ML IJ SOLN
INTRAMUSCULAR | Status: AC
  Filled 2015-03-29: qty 2

## 2015-03-29 MED ORDER — ONDANSETRON HCL 4 MG/2ML IJ SOLN
4.0000 mg | Freq: Once | INTRAMUSCULAR | Status: AC
  Administered 2015-03-29: 4 mg via INTRAVENOUS

## 2015-03-29 MED ORDER — SODIUM CHLORIDE 0.9 % IV BOLUS (SEPSIS)
1000.0000 mL | Freq: Once | INTRAVENOUS | Status: AC
  Administered 2015-03-29: 1000 mL via INTRAVENOUS

## 2015-03-29 MED ORDER — PROMETHAZINE HCL 25 MG/ML IJ SOLN
25.0000 mg | Freq: Once | INTRAMUSCULAR | Status: AC
  Administered 2015-03-29: 25 mg via INTRAVENOUS
  Filled 2015-03-29: qty 1

## 2015-03-29 MED ORDER — LORAZEPAM 2 MG/ML IJ SOLN
1.0000 mg | Freq: Once | INTRAMUSCULAR | Status: AC
Start: 2015-03-29 — End: 2015-03-29
  Administered 2015-03-29: 1 mg via INTRAVENOUS
  Filled 2015-03-29: qty 1

## 2015-03-29 NOTE — ED Provider Notes (Signed)
MSE was initiated and I personally evaluated the patient and placed orders (if any) at  7:46 PM on March 29, 2015.  The patient appears stable so that the remainder of the MSE may be completed by another Donna Cook.  Donna Cook is a 29 y.o. female withno Past medical history comes in for evaluation of nausea and vomiting. Patient states earlier this morning she began to feel nauseous and has been vomiting all day. She reports using "one of those pills that dissolves under your tongue" for the nausea, but this did not help. She reports associated diffuse abdominal discomfort that she relates to the constant vomiting area and emesis is nonbloody. She denies fevers, chills, urinary symptoms, back pain, chest pain, short of breath, numbness or weakness. No sick contacts. Last menstrual period was 2 weeks ago. Patient does endorse marijuana use. Denies alcohol use or other illicit drug use.  On exam, patient appears tired, but nontoxic. However, she answers questions appropriately. Moist mucous membranes. Normal heart and lung sounds. Abdomen is soft and nondistended without any focal tenderness. Negative McBurney's. Mental status, motor strength appear to be baseline for patient.  Filed Vitals:   03/29/15 1844 03/29/15 1854 03/29/15 1943  BP: 130/90  140/81  Pulse: 88  65  Temp:  98.1 F (36.7 C)   TempSrc:  Oral   Resp: 20  18  SpO2: 98%  99%     Joycie Peek, PA-C 03/29/15 1950  Mancel Bale, MD 03/29/15 2333

## 2015-03-29 NOTE — ED Notes (Signed)
Pt reports she woke up this morning feeling weak and has vomited multiple times today and is vomiting yellow bile during this triage. Pt reporting generalized abdominal pain.

## 2015-03-29 NOTE — ED Provider Notes (Signed)
CSN: [id number]     Arrival date & time 03/29/15  1830 History   First MD Initiated Contact with Patient 03/29/15 1936     Chief Complaint  Patient presents with  . Emesis     (Consider location/radiation/quality/duration/timing/severity/associated sxs/prior Treatment) Patient is a 29 y.o. female presenting with vomiting. The history is provided by the patient. No language interpreter was used.  Emesis Severity:  Moderate Duration:  1 day Timing:  Constant Number of daily episodes:  5 Quality:  Stomach contents Progression:  Unchanged Chronicity:  New Recent urination:  Normal Context: not post-tussive and not self-induced   Relieved by:  Nothing Worsened by:  Nothing tried Ineffective treatments:  None tried Associated symptoms: no abdominal pain, no arthralgias, no chills and no diarrhea   Risk factors: no alcohol use, no diabetes, not pregnant now, no prior abdominal surgery, no sick contacts, no suspect food intake and no travel to endemic areas     History reviewed. No pertinent past medical history. Past Surgical History  Procedure Laterality Date  . Cesarean section     No family history on file. Social History  Substance Use Topics  . Smoking status: Current Every Day Smoker -- 1.00 packs/day    Types: Cigarettes  . Smokeless tobacco: None  . Alcohol Use: Yes     Comment: socially   OB History    Gravida Para Term Preterm AB TAB SAB Ectopic Multiple Living   Review of Systems  Constitutional: Negative for fever, chills and fatigue.  HENT: Negative for trouble swallowing.   Eyes: Negative for visual disturbance.  Respiratory: Negative for shortness of breath.   Cardiovascular: Negative for chest pain and palpitations.  Gastrointestinal: Positive for nausea and vomiting. Negative for abdominal pain and diarrhea.  Genitourinary: Negative for dysuria and difficulty urinating.  Musculoskeletal: Negative for arthralgias and neck pain.  Skin:  Negative for color change.  Neurological: Negative for dizziness and weakness.  Psychiatric/Behavioral: Negative for dysphoric mood.      Allergies  Amoxicillin  Home Medications   Prior to Admission medications   Medication Sig Start Date End Date Taking? Authorizing Provider  butalbital-acetaminophen-caffeine (FIORICET) 50-325-40 MG per tablet Take 1 tablet by mouth every 6 (six) hours as needed for headache. 10/16/14   [name], PA-C  fluticasone (FLONASE) 50 MCG/ACT nasal spray Place 2 sprays into both nostrils daily. 10/16/14   [name], PA-C  ibuprofen (ADVIL,MOTRIN) 200 MG tablet Take 600-800 mg by mouth every 6 (six) hours as needed for mild pain or moderate pain.    Historical Provider, MD  medroxyPROGESTERone (DEPO-PROVERA) 150 MG/ML injection Inject 150 mg into the muscle every 3 (three) months.    Historical Provider, MD  mometasone (NASONEX) 50 MCG/ACT nasal spray Place 21 sprays into the nose daily.    Historical Provider, MD   BP 125/73 mmHg  Pulse 55  Temp(Src) 98.1 F (36.7 C) (Oral)  Resp 18  SpO2 100%  LMP 03/15/2015 Physical Exam  Constitutional: She is oriented to person, place, and time. She appears well-developed and well-nourished. No distress.  HENT:  Head: Normocephalic and atraumatic.  Eyes: Conjunctivae and EOM are normal.  Neck: Normal range of motion.  Cardiovascular: Normal rate and regular rhythm.  Exam reveals no gallop and no friction rub.   No murmur heard. Pulmonary/Chest: Effort normal and breath sounds normal. She has no wheezes. She has no rales. She exhibits no tenderness.  Abdominal: Soft. She exhibits no distension. There is tenderness. There is no rebound.  Generalized abdominal tenderness to palpation. No focal tenderness or peritoneal signs.   Musculoskeletal: Normal range of motion.  Neurological: She is alert and oriented to person, place, and time. Coordination normal.  Speech is goal-oriented. Moves limbs without  ataxia.   Skin: Skin is warm and dry.  Psychiatric: She has a normal mood and affect. Her behavior is normal.  Nursing note and vitals reviewed.   ED Course  Procedures (including critical care time) Labs Review Labs Reviewed  LIPASE, BLOOD - Abnormal; Notable for the following:    Lipase 15 (*)    All other components within normal limits  COMPREHENSIVE METABOLIC PANEL - Abnormal; Notable for the following:    Glucose, Bld 108 (*)    All other components within normal limits  CBC  URINALYSIS, ROUTINE W REFLEX MICROSCOPIC (NOT AT [facility])  POC URINE PREG, ED    Imaging Review No results found. I have personally reviewed and evaluated these images and lab results as part of my medical decision-making.   EKG Interpretation None      MDM   Final diagnoses:  Non-intractable vomiting with nausea, vomiting of unspecified type  Viral illness    9:34 PM Labs unremarkable for acute changes. Vitals stable and patient afebrile. Patient likely has a viral illness and will be treated symptomatically.   12:39 AM Patient feeling better and is tolerating PO crackers and sprite. Patient likely has a viral illness and will be discharged with phenergan for nausea.   [name], PA-C 03/30/15 0040  Mancel Bale, MD 03/31/15 [phone]

## 2015-03-30 LAB — URINALYSIS, ROUTINE W REFLEX MICROSCOPIC
GLUCOSE, UA: NEGATIVE mg/dL
Hgb urine dipstick: NEGATIVE
Ketones, ur: >80 mg/dL — AB
Leukocytes, UA: NEGATIVE
NITRITE: NEGATIVE
Protein, ur: 30 mg/dL — AB
SPECIFIC GRAVITY, URINE: 1.034 — AB (ref 1.005–1.030)
Urobilinogen, UA: 0.2 mg/dL (ref 0.0–1.0)
pH: 6 (ref 5.0–8.0)

## 2015-03-30 LAB — URINE MICROSCOPIC-ADD ON

## 2015-03-30 LAB — POC URINE PREG, ED: Preg Test, Ur: NEGATIVE

## 2015-03-30 MED ORDER — PROMETHAZINE HCL 25 MG PO TABS: 25.0000 mg | ORAL_TABLET | Freq: Four times a day (QID) | ORAL | Status: DC | PRN

## 2015-03-30 NOTE — Discharge Instructions (Signed)
Take phenergan as needed for nausea. Refer to attached documents for more information. Return to the ED with worsening or concerning symptoms.  °

## 2015-04-01 ENCOUNTER — Encounter (HOSPITAL_COMMUNITY): Payer: Self-pay | Admitting: Emergency Medicine

## 2015-04-01 ENCOUNTER — Emergency Department (HOSPITAL_COMMUNITY)
Admission: EM | Admit: 2015-04-01 | Discharge: 2015-04-01 | Disposition: A | Discharge status: Home / Self Care | Payer: Medicaid Other | Attending: Emergency Medicine | Admitting: Emergency Medicine

## 2015-04-01 DIAGNOSIS — Z7951 Long term (current) use of inhaled steroids: Secondary | ICD-10-CM | POA: Insufficient documentation

## 2015-04-01 DIAGNOSIS — Z880 Allergy status to penicillin: Secondary | ICD-10-CM | POA: Insufficient documentation

## 2015-04-01 DIAGNOSIS — R109 Unspecified abdominal pain: Secondary | ICD-10-CM | POA: Insufficient documentation

## 2015-04-01 DIAGNOSIS — R112 Nausea with vomiting, unspecified: Secondary | ICD-10-CM

## 2015-04-01 DIAGNOSIS — Z3202 Encounter for pregnancy test, result negative: Secondary | ICD-10-CM | POA: Insufficient documentation

## 2015-04-01 DIAGNOSIS — Z720 Tobacco use: Secondary | ICD-10-CM | POA: Insufficient documentation

## 2015-04-01 LAB — COMPREHENSIVE METABOLIC PANEL
ALK PHOS: 72 U/L (ref 38–126)
ALT: 20 U/L (ref 14–54)
ANION GAP: 8 (ref 5–15)
AST: 23 U/L (ref 15–41)
Albumin: 4.4 g/dL (ref 3.5–5.0)
BILIRUBIN TOTAL: 0.8 mg/dL (ref 0.3–1.2)
BUN: 10 mg/dL (ref 6–20)
CALCIUM: 9.1 mg/dL (ref 8.9–10.3)
CO2: 26 mmol/L (ref 22–32)
Chloride: 101 mmol/L (ref 101–111)
Creatinine, Ser: 0.88 mg/dL (ref 0.44–1.00)
GFR calc Af Amer: >60 mL/min (ref >60)
GFR calc non Af Amer: >60 mL/min (ref >60)
Glucose, Bld: 112 mg/dL — ABNORMAL HIGH (ref 65–99)
POTASSIUM: 3 mmol/L — AB (ref 3.5–5.1)
Sodium: 135 mmol/L (ref 135–145)
Total Protein: 7.7 g/dL (ref 6.5–8.1)

## 2015-04-01 LAB — CBC
HEMATOCRIT: 40.2 % (ref 36.0–46.0)
Hemoglobin: 13.3 g/dL (ref 12.0–15.0)
MCH: 28 pg (ref 26.0–34.0)
MCHC: 33.1 g/dL (ref 30.0–36.0)
MCV: 84.6 fL (ref 78.0–100.0)
Platelets: 312 10*3/uL (ref 150–400)
RBC: 4.75 MIL/uL (ref 3.87–5.11)
RDW: 13.3 % (ref 11.5–15.5)
WBC: 10.7 10*3/uL — AB (ref 4.0–10.5)

## 2015-04-01 LAB — URINALYSIS, ROUTINE W REFLEX MICROSCOPIC
GLUCOSE, UA: NEGATIVE mg/dL
HGB URINE DIPSTICK: NEGATIVE
Nitrite: NEGATIVE
PH: 6 (ref 5.0–8.0)
PROTEIN: 30 mg/dL — AB
Specific Gravity, Urine: 1.029 (ref 1.005–1.030)
Urobilinogen, UA: 1 mg/dL (ref 0.0–1.0)

## 2015-04-01 LAB — URINE MICROSCOPIC-ADD ON

## 2015-04-01 LAB — LIPASE, BLOOD: Lipase: 18 U/L — ABNORMAL LOW (ref 22–51)

## 2015-04-01 LAB — I-STAT BETA HCG BLOOD, ED (MC, WL, AP ONLY)

## 2015-04-01 MED ORDER — PROMETHAZINE HCL 25 MG/ML IJ SOLN
12.5000 mg | Freq: Once | INTRAMUSCULAR | Status: AC
  Administered 2015-04-01: 12.5 mg via INTRAVENOUS
  Filled 2015-04-01: qty 1

## 2015-04-01 MED ORDER — SODIUM CHLORIDE 0.9 % IV BOLUS (SEPSIS)
1000.0000 mL | Freq: Once | INTRAVENOUS | Status: AC
  Administered 2015-04-01: 1000 mL via INTRAVENOUS

## 2015-04-01 MED ORDER — ONDANSETRON HCL 4 MG/2ML IJ SOLN
4.0000 mg | Freq: Once | INTRAMUSCULAR | Status: AC
  Administered 2015-04-01: 4 mg via INTRAVENOUS
  Filled 2015-04-01: qty 2

## 2015-04-01 NOTE — ED Provider Notes (Signed)
CSN: [id number]     Arrival date & time 04/01/15  0100 History  By signing my name below, I, Donna Cook, attest that this documentation has been prepared under the direction and in the presence of [name], PA-C. Electronically Signed: [name], ED Scribe. 04/01/2015. 2:03 AM.    Chief Complaint  Patient presents with  . Emesis  . Abdominal Pain   The history is provided by the patient. No language interpreter was used.   HPI Comments: Donna Cook is a 29 y.o. female who presents to the Emergency Department complaining of new vomiting onset 2 days prior. Pt states she has associated nausea and abdominal pain. Pt does report 1 episode of dark brown emesis. Pt reports feeling tired and fatigued as well due to vomiting. Pt denies any medications PTA. Pt states that the pain is non radiating. Pt denies fever, diarrhea or dysuria. Pt denies recent sick contacts.     History reviewed. No pertinent past medical history. Past Surgical History  Procedure Laterality Date  . Cesarean section     History reviewed. No pertinent family history. Social History  Substance Use Topics  . Smoking status: Current Every Day Smoker -- 1.00 packs/day    Types: Cigarettes  . Smokeless tobacco: None  . Alcohol Use: Yes     Comment: socially   OB History    Gravida Para Term Preterm AB TAB SAB Ectopic Multiple Living   Review of Systems  Constitutional: Negative for fever.  Respiratory: Negative for shortness of breath.   Cardiovascular: Negative for chest pain.  Gastrointestinal: Positive for nausea, vomiting and abdominal pain. Negative for diarrhea.  Genitourinary: Negative for dysuria and vaginal discharge.  Musculoskeletal: Negative for neck pain.  All other systems reviewed and are negative.     Allergies  Amoxicillin  Home Medications   Prior to Admission medications   Medication Sig Start Date End Date Taking? Authorizing Provider   butalbital-acetaminophen-caffeine (FIORICET) 50-325-40 MG per tablet Take 1 tablet by mouth every 6 (six) hours as needed for headache. 10/16/14   [name], PA-C  fluticasone (FLONASE) 50 MCG/ACT nasal spray Place 2 sprays into both nostrils daily. 10/16/14   [name], PA-C  ibuprofen (ADVIL,MOTRIN) 200 MG tablet Take 600-800 mg by mouth every 6 (six) hours as needed for mild pain or moderate pain.    Historical Provider, MD  medroxyPROGESTERone (DEPO-PROVERA) 150 MG/ML injection Inject 150 mg into the muscle every 3 (three) months.    Historical Provider, MD  mometasone (NASONEX) 50 MCG/ACT nasal spray Place 21 sprays into the nose daily.    Historical Provider, MD  promethazine (PHENERGAN) 25 MG tablet Take 1 tablet (25 mg total) by mouth every 6 (six) hours as needed for nausea or vomiting. 03/30/15   [name], PA-C   BP 93/67 mmHg  Pulse 78  Temp(Src) 98.2 F (36.8 C) (Oral)  Resp 18  SpO2 100%  LMP 03/15/2015   Physical Exam  Constitutional: She is oriented to person, place, and time. She appears well-developed and well-nourished. No distress.  HENT:  Head: Normocephalic and atraumatic.  Eyes: Conjunctivae and EOM are normal.  Neck: Neck supple. No tracheal deviation present.  Cardiovascular: Normal rate.   Pulmonary/Chest: Effort normal. No respiratory distress.  Abdominal: Soft. Bowel sounds are normal. She exhibits no distension. There is no tenderness. There is no rebound and no guarding.  Musculoskeletal: Normal range of motion.  Neurological: She is alert and oriented to person, place, and time.  Skin: Skin is warm and dry.  Psychiatric: She has a normal mood and affect. Her behavior is normal.  Nursing note and vitals reviewed.   ED Course  Procedures (including critical care time) DIAGNOSTIC STUDIES: Oxygen Saturation is 100% on RA, normal by my interpretation.    COORDINATION OF CARE: 2:02 AM-Discussed treatment plan with pt at bedside and pt  agreed to plan.     Labs Review Labs Reviewed  CBC - Abnormal; Notable for the following:    WBC 10.7 (*)    All other components within normal limits  LIPASE, BLOOD  COMPREHENSIVE METABOLIC PANEL  URINALYSIS, ROUTINE W REFLEX MICROSCOPIC (NOT AT [facility])  I-STAT BETA HCG BLOOD, ED ([facility], [facility], [facility] ONLY)   Results for orders placed or performed during the hospital encounter of 04/01/15  Lipase, blood  Result Value Ref Range   Lipase 18 (L) 22 - 51 U/L  Comprehensive metabolic panel  Result Value Ref Range   Sodium 135 135 - 145 mmol/L   Potassium 3.0 (L) 3.5 - 5.1 mmol/L   Chloride 101 101 - 111 mmol/L   CO2 26 22 - 32 mmol/L   Glucose, Bld 112 (H) 65 - 99 mg/dL   BUN 10 6 - 20 mg/dL   Creatinine, Ser 6.96 0.44 - 1.00 mg/dL   Calcium 9.1 8.9 - 29.5 mg/dL   Total Protein 7.7 6.5 - 8.1 g/dL   Albumin 4.4 3.5 - 5.0 g/dL   AST 23 15 - 41 U/L   ALT 20 14 - 54 U/L   Alkaline Phosphatase 72 38 - 126 U/L   Total Bilirubin 0.8 0.3 - 1.2 mg/dL   GFR calc non Af Amer >60 >60 mL/min   GFR calc Af Amer >60 >60 mL/min   Anion gap 8 5 - 15  CBC  Result Value Ref Range   WBC 10.7 (H) 4.0 - 10.5 K/uL   RBC 4.75 3.87 - 5.11 MIL/uL   Hemoglobin 13.3 12.0 - 15.0 g/dL   HCT 28.4 13.2 - 44.0 %   MCV 84.6 78.0 - 100.0 fL   MCH 28.0 26.0 - 34.0 pg   MCHC 33.1 30.0 - 36.0 g/dL   RDW 10.2 72.5 - 36.6 %   Platelets 312 150 - 400 K/uL  Urinalysis, Routine w reflex microscopic (not at [facility])  Result Value Ref Range   Color, Urine AMBER (A) YELLOW   APPearance CLOUDY (A) CLEAR   Specific Gravity, Urine 1.029 1.005 - 1.030   pH 6.0 5.0 - 8.0   Glucose, UA NEGATIVE NEGATIVE mg/dL   Hgb urine dipstick NEGATIVE NEGATIVE   Bilirubin Urine SMALL (A) NEGATIVE   Ketones, ur >80 (A) NEGATIVE mg/dL   Protein, ur 30 (A) NEGATIVE mg/dL   Urobilinogen, UA 1.0 0.0 - 1.0 mg/dL   Nitrite NEGATIVE NEGATIVE   Leukocytes, UA SMALL (A) NEGATIVE  Urine microscopic-add on  Result Value Ref Range   Squamous  Epithelial / LPF FEW (A) RARE   WBC, UA 3-6 <3 WBC/hpf   Bacteria, UA FEW (A) RARE   Urine-Other MUCOUS PRESENT   I-Stat beta hCG blood, ED ([facility], [facility], [facility] only)  Result Value Ref Range   I-stat hCG, quantitative <5.0 <5 mIU/mL   Comment 3            Imaging Review No results found.    EKG Interpretation None      MDM   Final  diagnoses:  None   1. Emesis  The patient has no further vomiting in ED. She is drinking fluids and tolerating. She reports she is ready for discharge home. VS. Will d/c.   I personally performed the services described in this documentation, which was scribed in my presence. The recorded information has been reviewed and is accurate.       Elpidio Anis, PA-C 04/01/15 0350  Loren Racer, MD 04/01/15 [phone]

## 2015-04-01 NOTE — Discharge Instructions (Signed)

## 2015-04-01 NOTE — ED Notes (Signed)
Pt states that she has been vomiting x 3 days with generalized abdominal pain.  Previously evaluated at [facility] for same but no improvement. Alert and oriented.

## 2015-11-08 ENCOUNTER — Encounter (HOSPITAL_COMMUNITY): Payer: Self-pay | Admitting: Emergency Medicine

## 2015-11-08 ENCOUNTER — Emergency Department (HOSPITAL_COMMUNITY)
Admission: EM | Admit: 2015-11-08 | Discharge: 2015-11-08 | Disposition: A | Discharge status: Home / Self Care | Payer: 59 | Attending: Emergency Medicine | Admitting: Emergency Medicine

## 2015-11-08 ENCOUNTER — Emergency Department (HOSPITAL_COMMUNITY): Payer: 59

## 2015-11-08 DIAGNOSIS — Y92410 Unspecified street and highway as the place of occurrence of the external cause: Secondary | ICD-10-CM | POA: Diagnosis not present

## 2015-11-08 DIAGNOSIS — Z880 Allergy status to penicillin: Secondary | ICD-10-CM | POA: Insufficient documentation

## 2015-11-08 DIAGNOSIS — S46912A Strain of unspecified muscle, fascia and tendon at shoulder and upper arm level, left arm, initial encounter: Secondary | ICD-10-CM | POA: Diagnosis not present

## 2015-11-08 DIAGNOSIS — F17210 Nicotine dependence, cigarettes, uncomplicated: Secondary | ICD-10-CM | POA: Diagnosis not present

## 2015-11-08 DIAGNOSIS — S3992XA Unspecified injury of lower back, initial encounter: Secondary | ICD-10-CM | POA: Insufficient documentation

## 2015-11-08 DIAGNOSIS — S0990XA Unspecified injury of head, initial encounter: Secondary | ICD-10-CM | POA: Insufficient documentation

## 2015-11-08 DIAGNOSIS — Y9389 Activity, other specified: Secondary | ICD-10-CM | POA: Insufficient documentation

## 2015-11-08 DIAGNOSIS — S199XXA Unspecified injury of neck, initial encounter: Secondary | ICD-10-CM | POA: Insufficient documentation

## 2015-11-08 DIAGNOSIS — Y998 Other external cause status: Secondary | ICD-10-CM | POA: Insufficient documentation

## 2015-11-08 DIAGNOSIS — S4992XA Unspecified injury of left shoulder and upper arm, initial encounter: Secondary | ICD-10-CM | POA: Diagnosis present

## 2015-11-08 MED ORDER — CYCLOBENZAPRINE HCL 10 MG PO TABS
10.0000 mg | ORAL_TABLET | Freq: Once | ORAL | Status: AC
  Administered 2015-11-08: 10 mg via ORAL
  Filled 2015-11-08: qty 1

## 2015-11-08 MED ORDER — CYCLOBENZAPRINE HCL 10 MG PO TABS: 10.0000 mg | ORAL_TABLET | Freq: Three times a day (TID) | ORAL | Status: DC | PRN

## 2015-11-08 MED ORDER — HYDROCODONE-ACETAMINOPHEN 5-325 MG PO TABS
1.0000 | ORAL_TABLET | Freq: Once | ORAL | Status: AC
  Administered 2015-11-08: 1 via ORAL
  Filled 2015-11-08: qty 1

## 2015-11-08 MED ORDER — OXYCODONE-ACETAMINOPHEN 5-325 MG PO TABS: 1.0000 | ORAL_TABLET | ORAL | Status: DC | PRN

## 2015-11-08 MED ORDER — NAPROXEN 500 MG PO TABS: 500.0000 mg | ORAL_TABLET | Freq: Two times a day (BID) | ORAL | Status: DC

## 2015-11-08 MED ORDER — IBUPROFEN 200 MG PO TABS
600.0000 mg | ORAL_TABLET | Freq: Once | ORAL | Status: AC
  Administered 2015-11-08: 600 mg via ORAL
  Filled 2015-11-08: qty 3

## 2015-11-08 NOTE — ED Notes (Signed)
Patient transported to X-ray 
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
## 2015-11-09 ENCOUNTER — Encounter (HOSPITAL_BASED_OUTPATIENT_CLINIC_OR_DEPARTMENT_OTHER): Payer: Self-pay | Admitting: Emergency Medicine

## 2015-12-01 ENCOUNTER — Ambulatory Visit (HOSPITAL_COMMUNITY)
Admission: EM | Admit: 2015-12-01 | Discharge: 2015-12-01 | Disposition: A | Discharge status: Home / Self Care | Payer: No Typology Code available for payment source | Attending: Family Medicine | Admitting: Family Medicine

## 2015-12-01 ENCOUNTER — Encounter (HOSPITAL_COMMUNITY): Payer: Self-pay | Admitting: Emergency Medicine

## 2015-12-01 DIAGNOSIS — M542 Cervicalgia: Secondary | ICD-10-CM
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
## 2015-12-01 NOTE — Discharge Instructions (Signed)
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
## 2016-11-30 ENCOUNTER — Emergency Department (HOSPITAL_COMMUNITY)
Admission: EM | Admit: 2016-11-30 | Discharge: 2016-11-30 | Disposition: A | Discharge status: Home / Self Care | Payer: Medicaid Other | Attending: Emergency Medicine | Admitting: Emergency Medicine

## 2016-11-30 ENCOUNTER — Encounter (HOSPITAL_COMMUNITY): Payer: Self-pay | Admitting: Emergency Medicine

## 2016-11-30 DIAGNOSIS — F17210 Nicotine dependence, cigarettes, uncomplicated: Secondary | ICD-10-CM | POA: Insufficient documentation

## 2016-11-30 DIAGNOSIS — R1084 Generalized abdominal pain: Secondary | ICD-10-CM | POA: Diagnosis not present

## 2016-11-30 DIAGNOSIS — R112 Nausea with vomiting, unspecified: Secondary | ICD-10-CM | POA: Insufficient documentation

## 2016-11-30 DIAGNOSIS — Z5321 Procedure and treatment not carried out due to patient leaving prior to being seen by health care provider: Secondary | ICD-10-CM | POA: Insufficient documentation

## 2016-11-30 DIAGNOSIS — K2900 Acute gastritis without bleeding: Secondary | ICD-10-CM | POA: Diagnosis not present

## 2016-11-30 DIAGNOSIS — F1210 Cannabis abuse, uncomplicated: Secondary | ICD-10-CM | POA: Insufficient documentation

## 2016-11-30 DIAGNOSIS — R1013 Epigastric pain: Secondary | ICD-10-CM | POA: Diagnosis not present

## 2016-11-30 LAB — CBC WITH DIFFERENTIAL/PLATELET
Basophils Absolute: 0 10*3/uL (ref 0.0–0.1)
Basophils Relative: 0 %
EOS ABS: 0 10*3/uL (ref 0.0–0.7)
EOS PCT: 0 %
HCT: 39.4 % (ref 36.0–46.0)
HEMOGLOBIN: 12.9 g/dL (ref 12.0–15.0)
LYMPHS PCT: 14 %
Lymphs Abs: 1 10*3/uL (ref 0.7–4.0)
MCH: 28.4 pg (ref 26.0–34.0)
MCHC: 32.7 g/dL (ref 30.0–36.0)
MCV: 86.6 fL (ref 78.0–100.0)
MONOS PCT: 2 %
Monocytes Absolute: 0.1 10*3/uL (ref 0.1–1.0)
NEUTROS PCT: 84 %
Neutro Abs: 6.1 10*3/uL (ref 1.7–7.7)
Platelets: 287 10*3/uL (ref 150–400)
RBC: 4.55 MIL/uL (ref 3.87–5.11)
RDW: 13.3 % (ref 11.5–15.5)
WBC: 7.3 10*3/uL (ref 4.0–10.5)

## 2016-11-30 LAB — COMPREHENSIVE METABOLIC PANEL
ALK PHOS: 72 U/L (ref 38–126)
ALT: 15 U/L (ref 14–54)
ANION GAP: 11 (ref 5–15)
AST: 22 U/L (ref 15–41)
Albumin: 4.2 g/dL (ref 3.5–5.0)
BUN: 8 mg/dL (ref 6–20)
CALCIUM: 9.5 mg/dL (ref 8.9–10.3)
CO2: 25 mmol/L (ref 22–32)
CREATININE: 0.83 mg/dL (ref 0.44–1.00)
Chloride: 102 mmol/L (ref 101–111)
Glucose, Bld: 142 mg/dL — ABNORMAL HIGH (ref 65–99)
Potassium: 3.8 mmol/L (ref 3.5–5.1)
SODIUM: 138 mmol/L (ref 135–145)
TOTAL PROTEIN: 7.6 g/dL (ref 6.5–8.1)
Total Bilirubin: 0.6 mg/dL (ref 0.3–1.2)

## 2016-11-30 LAB — RAPID URINE DRUG SCREEN, HOSP PERFORMED
Amphetamines: NOT DETECTED
Barbiturates: NOT DETECTED
Benzodiazepines: NOT DETECTED
Cocaine: POSITIVE — AB
OPIATES: NOT DETECTED
Tetrahydrocannabinol: POSITIVE — AB

## 2016-11-30 LAB — LIPASE, BLOOD: LIPASE: 20 U/L (ref 11–51)

## 2016-11-30 LAB — PREGNANCY, URINE: PREG TEST UR: NEGATIVE

## 2016-11-30 MED ORDER — ONDANSETRON 8 MG PO TBDP
8.0000 mg | ORAL_TABLET | Freq: Once | ORAL | Status: AC
  Administered 2016-11-30: 8 mg via ORAL
  Filled 2016-11-30: qty 1

## 2016-11-30 MED ORDER — SODIUM CHLORIDE 0.9 % IV BOLUS (SEPSIS): 1000.0000 mL | Freq: Once | INTRAVENOUS | Status: DC

## 2016-11-30 MED ORDER — PROMETHAZINE HCL 25 MG/ML IJ SOLN
25.0000 mg | Freq: Once | INTRAMUSCULAR | Status: AC
  Administered 2016-11-30: 25 mg via INTRAMUSCULAR
  Filled 2016-11-30: qty 1

## 2016-11-30 MED ORDER — LORAZEPAM 2 MG/ML IJ SOLN
1.0000 mg | Freq: Once | INTRAMUSCULAR | Status: DC
  Filled 2016-11-30: qty 1

## 2016-11-30 MED ORDER — ONDANSETRON 4 MG PO TBDP
4.0000 mg | ORAL_TABLET | Freq: Once | ORAL | Status: AC
  Administered 2016-11-30: 4 mg via ORAL

## 2016-11-30 MED ORDER — FAMOTIDINE IN NACL 20-0.9 MG/50ML-% IV SOLN
20.0000 mg | Freq: Once | INTRAVENOUS | Status: DC
  Filled 2016-11-30: qty 50

## 2016-11-30 MED ORDER — ONDANSETRON 4 MG PO TBDP
ORAL_TABLET | ORAL | Status: AC
  Filled 2016-11-30: qty 1

## 2016-11-30 MED ORDER — FAMOTIDINE 20 MG PO TABS
20.0000 mg | ORAL_TABLET | Freq: Once | ORAL | Status: AC
  Administered 2016-11-30: 20 mg via ORAL
  Filled 2016-11-30: qty 1

## 2016-11-30 MED ORDER — ALUM & MAG HYDROXIDE-SIMETH 200-200-20 MG/5ML PO SUSP
15.0000 mL | Freq: Once | ORAL | Status: AC
  Administered 2016-11-30: 15 mL via ORAL
  Filled 2016-11-30: qty 30

## 2016-11-30 MED ORDER — ONDANSETRON 8 MG PO TBDP: 8.0000 mg | ORAL_TABLET | Freq: Three times a day (TID) | ORAL | 0 refills | Status: DC | PRN

## 2016-11-30 NOTE — ED Notes (Signed)
Patient given ginger ale for po fluid challenge  

## 2016-11-30 NOTE — ED Notes (Signed)
Called for vitals x2 

## 2016-11-30 NOTE — ED Notes (Signed)
I was asked by her primary care nurse to perform IV start. Pt. Adamantly refuses to allow IV or blood draw stick.

## 2016-11-30 NOTE — Discharge Instructions (Signed)
It was our pleasure to provide your ER care today - we hope that you feel better.  Rest. Drink plenty of fluids.  You may try pepcid and maalox as need for symptom relief.  You may take zofran as need for nausea.  Note that marijuana use is associated with a recurrent upper abdominal pain and vomiting illness - avoid marijuana use.   Follow up with primary care doctor in the next couple days if symptoms fail to improve/resolve.  Return to ER if worse, new symptoms, severe pain, persistent vomiting, fevers, other concern.  You were given sedating medication in the ER - no driving for the next 6 hours.

## 2016-11-30 NOTE — ED Triage Notes (Signed)
Pt c/o nausea and vomiting since yesterday. [facility] but left due to waiting time. [facility].

## 2016-11-30 NOTE — ED Provider Notes (Signed)
[facility]-EMERGENCY DEPT Provider Note   CSN: [id number] Arrival date & time: 11/30/16  0718     History   Chief Complaint Chief Complaint  Patient presents with  . Emesis    HPI Donna Cook is a 31 y.o. female.  Patient c/o epigastric pain and nausea/vomiting for the past day. Emesis clear, not bloody or bilious. Epigastric pain moderate, persistent, non radiating. No fever or chills. No hx pud, gallstones or pancreatitis. Hx thc use. Denies chest pain or sob. No cough or uri c/o. No gu c/o.    The history is provided by the patient.  Emesis   Associated symptoms include abdominal pain. Pertinent negatives include no fever and no headaches.    History reviewed. No pertinent past medical history.  There are no active problems to display for this patient.   Past Surgical History:  Procedure Laterality Date  . CESAREAN SECTION      OB History    Gravida Para Term Preterm AB Living   2       1 1    SAB TAB Ectopic Multiple Live Births                   Home Medications    Prior to Admission medications   Medication Sig Start Date End Date Taking? Authorizing Provider  cyclobenzaprine (FLEXERIL) 10 MG tablet Take 1 tablet (10 mg total) by mouth 3 (three) times daily as needed for muscle spasms. 11/08/15   Pricilla LovelessGoldston, Scott, MD  naproxen (NAPROSYN) 500 MG tablet Take 1 tablet (500 mg total) by mouth 2 (two) times daily with a meal. 11/08/15   Pricilla LovelessGoldston, Scott, MD  oxyCODONE-acetaminophen (PERCOCET) 5-325 MG tablet Take 1 tablet by mouth every 4 (four) hours as needed for severe pain. 11/08/15   Pricilla LovelessGoldston, Scott, MD    Family History History reviewed. No pertinent family history.  Social History Social History  Substance Use Topics  . Smoking status: Current Every Day Smoker    Packs/day: 1.00    Types: Cigarettes  . Smokeless tobacco: Never Used  . Alcohol use Yes     Comment: socially     Allergies   Amoxicillin   Review of Systems Review of Systems    Constitutional: Negative for fever.  HENT: Negative for sore throat.   Eyes: Negative for redness.  Respiratory: Negative for shortness of breath.   Cardiovascular: Negative for chest pain.  Gastrointestinal: Positive for abdominal pain and vomiting.  Genitourinary: Negative for flank pain.  Musculoskeletal: Negative for back pain.  Skin: Negative for rash.  Neurological: Negative for headaches.  Hematological: Does not bruise/bleed easily.  Psychiatric/Behavioral: Negative for confusion.     Physical Exam Updated Vital Signs BP (!) 138/94 (BP Location: Left Arm)   Pulse 66   Temp 98.2 F (36.8 C)   Resp 16   LMP 11/12/2016 (Exact Date)   SpO2 100%   Physical Exam  Constitutional: She appears well-developed and well-nourished. No distress.  HENT:  Mouth/Throat: Oropharynx is clear and moist.  Eyes: Conjunctivae are normal. No scleral icterus.  Neck: Neck supple. No tracheal deviation present.  Cardiovascular: Normal rate, regular rhythm, normal heart sounds and intact distal pulses.  Exam reveals no gallop and no friction rub.   No murmur heard. Pulmonary/Chest: Effort normal and breath sounds normal. No respiratory distress.  Abdominal: Soft. Normal appearance and bowel sounds are normal. She exhibits no distension and no mass. There is tenderness. There is no rebound and no guarding. No hernia.  Mild epigastric tenderness  Genitourinary:  Genitourinary Comments: No cva tenderness  Musculoskeletal: She exhibits no edema.  Neurological: She is alert.  Skin: Skin is warm and dry. No rash noted. She is not diaphoretic.  Psychiatric: She has a normal mood and affect.  Nursing note and vitals reviewed.    ED Treatments / Results  Labs (all labs ordered are listed, but only abnormal results are displayed) Results for orders placed or performed during the hospital encounter of 11/30/16  Pregnancy, urine  Result Value Ref Range   Preg Test, Ur NEGATIVE NEGATIVE  Rapid  urine drug screen (hospital performed)  Result Value Ref Range   Opiates NONE DETECTED NONE DETECTED   Cocaine POSITIVE (A) NONE DETECTED   Benzodiazepines NONE DETECTED NONE DETECTED   Amphetamines NONE DETECTED NONE DETECTED   Tetrahydrocannabinol POSITIVE (A) NONE DETECTED   Barbiturates NONE DETECTED NONE DETECTED    EKG  EKG Interpretation None       Radiology No results found.  Procedures Procedures (including critical care time)  Medications Ordered in ED Medications  sodium chloride 0.9 % bolus 1,000 mL (1,000 mLs Intravenous Refused 11/30/16 0952)  famotidine (PEPCID) IVPB 20 mg premix (20 mg Intravenous Refused 11/30/16 0952)  LORazepam (ATIVAN) injection 1 mg (1 mg Intravenous Refused 11/30/16 0952)  alum & mag hydroxide-simeth (MAALOX/MYLANTA) 200-200-20 MG/5ML suspension 15 mL (not administered)  famotidine (PEPCID) tablet 20 mg (20 mg Oral Given 11/30/16 0950)  ondansetron (ZOFRAN-ODT) disintegrating tablet 8 mg (8 mg Oral Given 11/30/16 0951)     Initial Impression / Assessment and Plan / ED Course  I have reviewed the triage vital signs and the nursing notes.  Pertinent labs & imaging results that were available during my care of the patient were reviewed by me and considered in my medical decision making (see chart for details).  Iv ns. Labs.   Pepcid. Zofran.   Nursing indicates patient refusing iv sticks for lab and ivf/meds.    I explained to patient reasons for iv access, ivf, meds etc - pt voices understanding, but refusing further sticks/iv.    Will try po fluids.  Reviewed nursing notes and prior charts for additional history.   pts blood work from earlier today unremarkable.  abd soft nt.  Recurrent nausea.  Pt continues to decline iv.  Phenergan im.    Po fluids.  abd is soft nt.  Suspect possible thc related emesis/pain syndrome.   Final Clinical Impressions(s) / ED Diagnoses   Final diagnoses:  None    New Prescriptions New  Prescriptions   No medications on file     Cathren Laine, MD 11/30/16 1256

## 2016-11-30 NOTE — ED Triage Notes (Signed)
Pt to ED with c/o generalized abd pain with nausea and vomiting.  Denies diarrhea.  Onset approx 3-4 hours ago.

## 2016-11-30 NOTE — ED Notes (Signed)
EDP notified that the patient refused to have Tim, RN start IV or obtain labs.

## 2016-11-30 NOTE — ED Notes (Signed)
Immediately after receiving Phenergan IM the patient requested water. Writer stated, "well let's wait until you are not nauseated then we will get you something to drink.'  Patient stated, she was not nauseated and wanted water. Water given to the patient.

## 2016-11-30 NOTE — ED Notes (Signed)
Patient's significant other came to the desk and stated that the patient threw up the ginger ale that was given for po fluid challenge. EDP notified.

## 2016-12-01 ENCOUNTER — Emergency Department (HOSPITAL_COMMUNITY)
Admission: EM | Admit: 2016-12-01 | Discharge: 2016-12-01 | Disposition: A | Discharge status: Home / Self Care | Payer: Medicaid Other | Attending: Emergency Medicine | Admitting: Emergency Medicine

## 2016-12-01 ENCOUNTER — Encounter (HOSPITAL_COMMUNITY): Payer: Self-pay | Admitting: Emergency Medicine

## 2016-12-01 ENCOUNTER — Emergency Department (HOSPITAL_COMMUNITY): Payer: Medicaid Other

## 2016-12-01 DIAGNOSIS — R1084 Generalized abdominal pain: Secondary | ICD-10-CM | POA: Diagnosis not present

## 2016-12-01 DIAGNOSIS — Z79899 Other long term (current) drug therapy: Secondary | ICD-10-CM | POA: Insufficient documentation

## 2016-12-01 DIAGNOSIS — R112 Nausea with vomiting, unspecified: Secondary | ICD-10-CM | POA: Insufficient documentation

## 2016-12-01 DIAGNOSIS — F17210 Nicotine dependence, cigarettes, uncomplicated: Secondary | ICD-10-CM | POA: Diagnosis not present

## 2016-12-01 DIAGNOSIS — R52 Pain, unspecified: Secondary | ICD-10-CM

## 2016-12-01 LAB — URINALYSIS, ROUTINE W REFLEX MICROSCOPIC
BILIRUBIN URINE: NEGATIVE
Glucose, UA: NEGATIVE mg/dL
HGB URINE DIPSTICK: NEGATIVE
Ketones, ur: 5 mg/dL — AB
Leukocytes, UA: NEGATIVE
Nitrite: NEGATIVE
PROTEIN: 100 mg/dL — AB
Specific Gravity, Urine: 1.025 (ref 1.005–1.030)
pH: 6 (ref 5.0–8.0)

## 2016-12-01 LAB — I-STAT CHEM 8, ED
BUN: 8 mg/dL (ref 6–20)
Calcium, Ion: 1.14 mmol/L — ABNORMAL LOW (ref 1.15–1.40)
Chloride: 96 mmol/L — ABNORMAL LOW (ref 101–111)
Creatinine, Ser: 0.9 mg/dL (ref 0.44–1.00)
GLUCOSE: 99 mg/dL (ref 65–99)
HCT: 45 % (ref 36.0–46.0)
HEMOGLOBIN: 15.3 g/dL — AB (ref 12.0–15.0)
POTASSIUM: 3.3 mmol/L — AB (ref 3.5–5.1)
Sodium: 137 mmol/L (ref 135–145)
TCO2: 30 mmol/L (ref 0–100)

## 2016-12-01 LAB — POC OCCULT BLOOD, ED: FECAL OCCULT BLD: NEGATIVE

## 2016-12-01 LAB — LIPASE, BLOOD: LIPASE: 29 U/L (ref 11–51)

## 2016-12-01 MED ORDER — PANTOPRAZOLE SODIUM 40 MG IV SOLR
40.0000 mg | Freq: Once | INTRAVENOUS | Status: AC
  Administered 2016-12-01: 40 mg via INTRAVENOUS
  Filled 2016-12-01: qty 40

## 2016-12-01 MED ORDER — PROMETHAZINE HCL 25 MG PO TABS: 25.0000 mg | ORAL_TABLET | Freq: Four times a day (QID) | ORAL | 0 refills | Status: DC | PRN

## 2016-12-01 MED ORDER — DIPHENHYDRAMINE HCL 25 MG PO CAPS
25.0000 mg | ORAL_CAPSULE | Freq: Once | ORAL | Status: AC
  Administered 2016-12-01: 25 mg via ORAL
  Filled 2016-12-01: qty 1

## 2016-12-01 MED ORDER — DICYCLOMINE HCL 10 MG/ML IM SOLN
20.0000 mg | Freq: Once | INTRAMUSCULAR | Status: AC
  Administered 2016-12-01: 20 mg via INTRAMUSCULAR
  Filled 2016-12-01: qty 2

## 2016-12-01 MED ORDER — FAMOTIDINE IN NACL 20-0.9 MG/50ML-% IV SOLN
20.0000 mg | Freq: Once | INTRAVENOUS | Status: AC
  Administered 2016-12-01: 20 mg via INTRAVENOUS
  Filled 2016-12-01: qty 50

## 2016-12-01 MED ORDER — OMEPRAZOLE 20 MG PO CPDR: 20.0000 mg | DELAYED_RELEASE_CAPSULE | Freq: Every day | ORAL | 0 refills | Status: DC

## 2016-12-01 MED ORDER — KETOROLAC TROMETHAMINE 15 MG/ML IJ SOLN
15.0000 mg | Freq: Once | INTRAMUSCULAR | Status: AC
  Administered 2016-12-01: 15 mg via INTRAVENOUS
  Filled 2016-12-01: qty 1

## 2016-12-01 MED ORDER — POTASSIUM CHLORIDE CRYS ER 20 MEQ PO TBCR
40.0000 meq | EXTENDED_RELEASE_TABLET | Freq: Once | ORAL | Status: AC
  Administered 2016-12-01: 40 meq via ORAL
  Filled 2016-12-01: qty 2

## 2016-12-01 MED ORDER — HALOPERIDOL LACTATE 5 MG/ML IJ SOLN
5.0000 mg | Freq: Once | INTRAMUSCULAR | Status: AC
  Administered 2016-12-01: 5 mg via INTRAVENOUS
  Filled 2016-12-01: qty 1

## 2016-12-01 MED ORDER — SODIUM CHLORIDE 0.9 % IV BOLUS (SEPSIS)
1000.0000 mL | Freq: Once | INTRAVENOUS | Status: AC
  Administered 2016-12-01: 1000 mL via INTRAVENOUS

## 2016-12-01 MED ORDER — CAPSAICIN 0.025 % EX CREA
TOPICAL_CREAM | Freq: Once | CUTANEOUS | Status: AC
  Administered 2016-12-01: 20:00:00 via TOPICAL
  Filled 2016-12-01: qty 60

## 2016-12-01 NOTE — ED Notes (Signed)
US being performed on patient.

## 2016-12-01 NOTE — Discharge Instructions (Signed)
Her lab work is reassuring. She did have a small decrease in your potassium. Make sure you are increasing her potassium intake through foods and have a follow-up appointment with her primary care doctor Dr. potassium rechecked. Clear liquid diet for 24 hours and advance her diet as tolerated. Use the Phenergan for nausea and pain. Motrin and Tylenol at home. Make sure you're taking the Prilosec and Pepcid as prescribed. Follow-up with a gastroenterologist. Return as needed.

## 2016-12-01 NOTE — ED Provider Notes (Signed)
[facility]-EMERGENCY DEPT Provider Note   CSN: [id number] Arrival date & time: 12/01/16  1743     History   Chief Complaint Chief Complaint  Patient presents with  . Emesis  . Nausea    HPI Donna Cook is a 31 y.o. female.  HPI 31 year old African-American female with no significant past medical history presents to the emergency Department today with complaints of ongoing nausea and vomiting for the past week. Patient states that she has had ongoing nausea and vomiting over the past week. Patient was seen in the ED yesterday for same where she refused IV and was given IM medications and sent home with Zofran and Pepcid. Patient states she was marijuana last week but is not spoke regular. She does have a positive UDS with marijuana yesterday. She states that her vomit has been nonbilious with small amount of coffee ground emesis and bright red blood over the past few days. Patient denies any diarrhea or blood in her stools. States her last bowel movement was this morning. She has been able to eat or drink anything for a whole week. Patient endorses generalized abdominal pain but no focal abdominal pain. She denies any urinary symptoms or vaginal bleeding or discharge. Denies any abdominal surgical history. Patient denies any fever, chills, headache, vision changes, lightheadedness, dizziness, chest pain, shortness breath, urinary symptoms, vaginal symptoms, or paresthesias. History reviewed. No pertinent past medical history.  There are no active problems to display for this patient.   Past Surgical History:  Procedure Laterality Date  . CESAREAN SECTION      OB History    Gravida Para Term Preterm AB Living   2       1 1    SAB TAB Ectopic Multiple Live Births                   Home Medications    Prior to Admission medications   Medication Sig Start Date End Date Taking? Authorizing Provider  ondansetron (ZOFRAN ODT) 8 MG disintegrating tablet Take 1 tablet (8 mg total) by  mouth every 8 (eight) hours as needed for nausea or vomiting. 11/30/16   Cathren Laine, MD    Family History No family history on file.  Social History Social History  Substance Use Topics  . Smoking status: Current Every Day Smoker    Packs/day: 1.00    Types: Cigarettes  . Smokeless tobacco: Never Used  . Alcohol use Yes     Comment: socially     Allergies   Amoxicillin   Review of Systems Review of Systems  Constitutional: Negative for chills and fever.  HENT: Negative for congestion.   Eyes: Negative for visual disturbance.  Respiratory: Negative for cough and shortness of breath.   Cardiovascular: Negative for chest pain.  Gastrointestinal: Positive for abdominal pain, nausea and vomiting. Negative for blood in stool and diarrhea.  Genitourinary: Negative for dysuria, flank pain, frequency, hematuria and urgency.  Skin: Negative.   Neurological: Negative for dizziness, syncope, weakness, light-headedness and headaches.     Physical Exam Updated Vital Signs BP 135/89   Pulse 67   Temp 99.3 F (37.4 C) (Oral)   Resp 16   LMP 11/12/2016 (Exact Date)   SpO2 100%   Physical Exam  Constitutional: She is oriented to person, place, and time. She appears well-developed and well-nourished. No distress.  Patient resting comfortably on the bed in no acute distress. Nontoxic appearing.  HENT:  Head: Normocephalic and atraumatic.  Mouth/Throat: Oropharynx is clear  and moist.  Eyes: Conjunctivae are normal. Right eye exhibits no discharge. Left eye exhibits no discharge. No scleral icterus.  Neck: Normal range of motion. Neck supple. No thyromegaly present.  Cardiovascular: Normal rate, regular rhythm, normal heart sounds and intact distal pulses.  Exam reveals no gallop and no friction rub.   No murmur heard. Pulmonary/Chest: Effort normal and breath sounds normal. No respiratory distress. She has no wheezes. She has no rales. She exhibits no tenderness.  Abdominal:  Soft. Bowel sounds are normal. She exhibits no distension. There is tenderness in the right upper quadrant, epigastric area and left upper quadrant. There is no rigidity, no rebound, no guarding, no CVA tenderness, no tenderness at McBurney's point and negative Murphy's sign.  Genitourinary:  Genitourinary Comments: Chaperone present for exam. Hemoccult negative. Soft brown stool noted in the rectal vault.  Musculoskeletal: Normal range of motion.  Lymphadenopathy:    She has no cervical adenopathy.  Neurological: She is alert and oriented to person, place, and time.  Skin: Skin is warm and dry. Capillary refill takes less than 2 seconds.  Nursing note and vitals reviewed.    ED Treatments / Results  Labs (all labs ordered are listed, but only abnormal results are displayed) Labs Reviewed  URINALYSIS, ROUTINE W REFLEX MICROSCOPIC - Abnormal; Notable for the following:       Result Value   APPearance HAZY (*)    Ketones, ur 5 (*)    Protein, ur 100 (*)    Bacteria, UA RARE (*)    Squamous Epithelial / LPF 0-5 (*)    All other components within normal limits  I-STAT CHEM 8, ED - Abnormal; Notable for the following:    Potassium 3.3 (*)    Chloride 96 (*)    Calcium, Ion 1.14 (*)    Hemoglobin 15.3 (*)    All other components within normal limits  URINE CULTURE  LIPASE, BLOOD  POC OCCULT BLOOD, ED    EKG  EKG Interpretation None       Radiology US Abdomen Limited Ruq  Result Date: 12/01/2016 CLINICAL DATA:  Right upper quadrant pain EXAM: US ABDOMEN LIMITED - RIGHT UPPER QUADRANT COMPARISON:  07/24/2014 abdominal ultrasound FINDINGS: Gallbladder: No gallstones or wall thickening visualized. No sonographic Murphy sign noted by sonographer. Common bile duct: Diameter: 3 mm Liver: No focal lesion identified. Within normal limits in parenchymal echogenicity. IMPRESSION: Normal right upper quadrant abdominal ultrasound. Electronically Signed   By: Tollie Eth M.D.   On:  12/01/2016 19:47    Procedures Procedures (including critical care time)  Medications Ordered in ED Medications  haloperidol lactate (HALDOL) injection 5 mg (5 mg Intravenous Given 12/01/16 1952)  sodium chloride 0.9 % bolus 1,000 mL (0 mLs Intravenous Stopped 12/01/16 2126)  capsaicin (ZOSTRIX) 0.025 % cream ( Topical Given 12/01/16 1954)  dicyclomine (BENTYL) injection 20 mg (20 mg Intramuscular Given 12/01/16 1953)  pantoprazole (PROTONIX) injection 40 mg (40 mg Intravenous Given 12/01/16 1954)  famotidine (PEPCID) IVPB 20 mg premix (0 mg Intravenous Stopped 12/01/16 2126)  ketorolac (TORADOL) 15 MG/ML injection 15 mg (15 mg Intravenous Given 12/01/16 1953)  diphenhydrAMINE (BENADRYL) capsule 25 mg (25 mg Oral Given 12/01/16 2126)  potassium chloride SA (K-DUR,KLOR-CON) CR tablet 40 mEq (40 mEq Oral Given 12/01/16 2125)     Initial Impression / Assessment and Plan / ED Course  I have reviewed the triage vital signs and the nursing notes.  Pertinent labs & imaging results that were available during my care  of the patient were reviewed by me and considered in my medical decision making (see chart for details).     Patient presents to the ED with persistent nausea and emesis with generalized abdominal cramping over the past week. Seen yesterday in the ED sent home with Zofran and Pepcid with little relief. Patient has been unable to keep anything down by mouth overnight. Labs yesterday revealed no leukocytosis. Urine pregnant test was negative. Today patient with mild hypokalemia 3.3 were placed orally. Creatinine is normal. Lipase is normal. Doubt pancreatitis. Ultrasound right upper quadrant reveals no concern for cholecystitis. UA with small amount of ketones and proteins with WBCs likely consistent with dehydration and emesis. Patient denies any urinary symptoms. Will not treat for UTI. EKG was obtained in the ED with normal QT. Patient given Haldol, Bentyl, fluids in the ED with significant  improvement in her symptoms. Abdominal exam without any focal tenderness concerning for appendicitis, diverticulitis, obstruction. Do not feel that further imaging is indicated. The patient feels ready for discharge. Repeat abdominal exam is benign without any focal tenderness. I suspect the patient's symptoms are likely due to marijuana use. This seems to be a chronic issue for patient. Have given her by mouth Phenergan. Patient does report small amount of blood in her vomit over the past few days. Hemoglobin is stable in the ED. No signs of hematocrit emesis in the ED. Hemoccult negative Protonix and Pepcid given. Continued PPI and Pepcid at home. Have given GI follow-up. The patient is nontoxic appearing. Appears in no acute distress on discharge. Pt is hemodynamically stable, in NAD, & able to ambulate in the ED. Pain has been managed & has no complaints prior to dc. Pt is comfortable with above plan and is stable for discharge at this time. All questions were answered prior to disposition. Strict return precautions for f/u to the ED were discussed.   Final Clinical Impressions(s) / ED Diagnoses   Final diagnoses:  Pain  Nausea and vomiting, intractability of vomiting not specified, unspecified vomiting type  Generalized abdominal pain    New Prescriptions New Prescriptions   OMEPRAZOLE (PRILOSEC) 20 MG CAPSULE    Take 1 capsule (20 mg total) by mouth daily.   PROMETHAZINE (PHENERGAN) 25 MG TABLET    Take 1 tablet (25 mg total) by mouth every 6 (six) hours as needed for nausea or vomiting.     Rise MuLeaphart, Mozell Haber T, PA-C 12/01/16 2251    Rolland PorterJames, Mark, MD 12/02/16 [phone]

## 2016-12-01 NOTE — ED Triage Notes (Signed)
Pt from home via EMS for N/V over last week.  States she hasn't been able to keep anything down for about a week.  Seen recently for same thing, given pepcid and zofran to take at home but states she can't keep them down.  Has not trhown up w/EMS. VS: 160/120, 88 bpm, 18 RR, CBG 111

## 2016-12-03 LAB — URINE CULTURE

## 2016-12-08 ENCOUNTER — Encounter: Payer: Self-pay | Admitting: Internal Medicine

## 2016-12-16 ENCOUNTER — Ambulatory Visit: Payer: 59 | Admitting: Physician Assistant

## 2016-12-23 ENCOUNTER — Emergency Department (HOSPITAL_COMMUNITY): Payer: Medicaid Other

## 2016-12-23 ENCOUNTER — Encounter (HOSPITAL_COMMUNITY): Payer: Self-pay | Admitting: *Deleted

## 2016-12-23 ENCOUNTER — Emergency Department (HOSPITAL_COMMUNITY)
Admission: EM | Admit: 2016-12-23 | Discharge: 2016-12-24 | Disposition: A | Discharge status: Home / Self Care | Payer: Medicaid Other | Attending: Emergency Medicine | Admitting: Emergency Medicine

## 2016-12-23 DIAGNOSIS — Z79899 Other long term (current) drug therapy: Secondary | ICD-10-CM | POA: Diagnosis not present

## 2016-12-23 DIAGNOSIS — R1110 Vomiting, unspecified: Secondary | ICD-10-CM

## 2016-12-23 DIAGNOSIS — R112 Nausea with vomiting, unspecified: Secondary | ICD-10-CM

## 2016-12-23 DIAGNOSIS — F17210 Nicotine dependence, cigarettes, uncomplicated: Secondary | ICD-10-CM | POA: Diagnosis not present

## 2016-12-23 LAB — URINALYSIS, ROUTINE W REFLEX MICROSCOPIC
BACTERIA UA: NONE SEEN
Bilirubin Urine: NEGATIVE
Glucose, UA: NEGATIVE mg/dL
HGB URINE DIPSTICK: NEGATIVE
KETONES UR: 80 mg/dL — AB
LEUKOCYTES UA: NEGATIVE
NITRITE: NEGATIVE
PROTEIN: 100 mg/dL — AB
SPECIFIC GRAVITY, URINE: 1.029 (ref 1.005–1.030)
pH: 6 (ref 5.0–8.0)

## 2016-12-23 LAB — COMPREHENSIVE METABOLIC PANEL
ALBUMIN: 4.5 g/dL (ref 3.5–5.0)
ALT: 17 U/L (ref 14–54)
ANION GAP: 11 (ref 5–15)
AST: 19 U/L (ref 15–41)
Alkaline Phosphatase: 78 U/L (ref 38–126)
BUN: 9 mg/dL (ref 6–20)
CO2: 25 mmol/L (ref 22–32)
Calcium: 9.4 mg/dL (ref 8.9–10.3)
Chloride: 99 mmol/L — ABNORMAL LOW (ref 101–111)
Creatinine, Ser: 0.73 mg/dL (ref 0.44–1.00)
GFR calc Af Amer: >60 mL/min (ref >60)
GFR calc non Af Amer: >60 mL/min (ref >60)
Glucose, Bld: 120 mg/dL — ABNORMAL HIGH (ref 65–99)
POTASSIUM: 3.4 mmol/L — AB (ref 3.5–5.1)
Sodium: 135 mmol/L (ref 135–145)
TOTAL PROTEIN: 8.3 g/dL — AB (ref 6.5–8.1)
Total Bilirubin: 0.5 mg/dL (ref 0.3–1.2)

## 2016-12-23 LAB — CBC
HEMATOCRIT: 38.9 % (ref 36.0–46.0)
HEMOGLOBIN: 13.2 g/dL (ref 12.0–15.0)
MCH: 29.1 pg (ref 26.0–34.0)
MCHC: 33.9 g/dL (ref 30.0–36.0)
MCV: 85.7 fL (ref 78.0–100.0)
Platelets: 340 10*3/uL (ref 150–400)
RBC: 4.54 MIL/uL (ref 3.87–5.11)
RDW: 13 % (ref 11.5–15.5)
WBC: 6.6 10*3/uL (ref 4.0–10.5)

## 2016-12-23 LAB — POC URINE PREG, ED: Preg Test, Ur: NEGATIVE

## 2016-12-23 LAB — LIPASE, BLOOD: Lipase: 19 U/L (ref 11–51)

## 2016-12-23 MED ORDER — SODIUM CHLORIDE 0.9 % IV BOLUS (SEPSIS)
1000.0000 mL | Freq: Once | INTRAVENOUS | Status: AC
  Administered 2016-12-23: 1000 mL via INTRAVENOUS

## 2016-12-23 MED ORDER — LORAZEPAM 2 MG/ML IJ SOLN
0.5000 mg | Freq: Once | INTRAMUSCULAR | Status: AC
  Administered 2016-12-23: 0.5 mg via INTRAVENOUS
  Filled 2016-12-23: qty 1

## 2016-12-23 MED ORDER — METOCLOPRAMIDE HCL 10 MG PO TABS: 10.0000 mg | ORAL_TABLET | Freq: Four times a day (QID) | ORAL | 0 refills | Status: DC

## 2016-12-23 MED ORDER — METOCLOPRAMIDE HCL 5 MG/ML IJ SOLN
10.0000 mg | Freq: Once | INTRAMUSCULAR | Status: AC
  Administered 2016-12-23: 10 mg via INTRAVENOUS
  Filled 2016-12-23: qty 2

## 2016-12-23 NOTE — ED Triage Notes (Signed)
Pt went to [employer] yesterday to eat and about an hour after [employer], pt had to have a BM.  After the BM, pt was been having emesis.  Pt a/o x 4.

## 2016-12-23 NOTE — ED Provider Notes (Signed)
[facility]-EMERGENCY DEPT Provider Note   CSN: [id number] Arrival date & time: 12/23/16  1138     History   Chief Complaint Chief Complaint  Patient presents with  . Emesis    HPI Donna Cook is a 31 y.o. female.  HPI patient presents to the emergency department with complaints of recurrent nausea and vomiting over the past several weeks.  She is scheduled to see [facility] in 1 week.  She states his been a recurrent issue over the past several weeks.  She denies fevers and chills.  No dysuria or urinary frequency.  She's undergone an ultrasound and a CT scan of her abdomen without acute pathology found.  Denies hematemesis.  Reports crampy abdominal pain.  No fevers   History reviewed. No pertinent past medical history.  There are no active problems to display for this patient.   Past Surgical History:  Procedure Laterality Date  . CESAREAN SECTION    . TONSILLECTOMY      OB History    Gravida Para Term Preterm AB Living   2       1 1    SAB TAB Ectopic Multiple Live Births                   Home Medications    Prior to Admission medications   Medication Sig Start Date End Date Taking? Authorizing Provider  aluminum-magnesium hydroxide 200-200 MG/5ML suspension Take 15 mLs by mouth every 6 (six) hours as needed for indigestion.   Yes [provider]  famotidine (PEPCID) 10 MG tablet Take 10 mg by mouth 2 (two) times daily as needed for heartburn or indigestion.   Yes [provider]  omeprazole (PRILOSEC) 20 MG capsule Take 1 capsule (20 mg total) by mouth daily. 12/01/16  Yes Leaphart, Iantha FallenKenneth T, PA-C  ondansetron (ZOFRAN ODT) 8 MG disintegrating tablet Take 1 tablet (8 mg total) by mouth every 8 (eight) hours as needed for nausea or vomiting. 11/30/16  Yes Cathren LaineSteinl, Carmita Boom, MD  polyethylene glycol (MIRALAX / GLYCOLAX) packet Take 17 g by mouth daily. 12/09/16 12/23/16 Yes [provider]  promethazine (PHENERGAN) 25 MG tablet Take 1 tablet (25 mg  total) by mouth every 6 (six) hours as needed for nausea or vomiting. 12/01/16  Yes Leaphart, Lynann BeaverKenneth T, PA-C  metoCLOPramide (REGLAN) 10 MG tablet Take 1 tablet (10 mg total) by mouth every 6 (six) hours. 12/23/16   Azalia Bilisampos, Rutilio Yellowhair, MD    Family History No family history on file.  Social History Social History  Substance Use Topics  . Smoking status: Current Every Day Smoker    Packs/day: 0.50    Types: Cigarettes  . Smokeless tobacco: Never Used  . Alcohol use Yes     Comment: socially     Allergies   Amoxicillin   Review of Systems Review of Systems  All other systems reviewed and are negative.    Physical Exam Updated Vital Signs BP (!) 144/94   Pulse 93   Temp 98.3 F (36.8 C) (Oral)   Resp 18   LMP 12/19/2016   SpO2 99%   Physical Exam  Constitutional: She is oriented to person, place, and time. She appears well-developed and well-nourished. No distress.  HENT:  Head: Normocephalic and atraumatic.  Eyes: EOM are normal.  Neck: Normal range of motion.  Cardiovascular: Normal rate, regular rhythm and normal heart sounds.   Pulmonary/Chest: Effort normal and breath sounds normal.  Abdominal: Soft. She exhibits no distension. There is no  tenderness.  Musculoskeletal: Normal range of motion.  Neurological: She is alert and oriented to person, place, and time.  Skin: Skin is warm and dry.  Psychiatric: She has a normal mood and affect. Judgment normal.  Nursing note and vitals reviewed.    ED Treatments / Results  Labs (all labs ordered are listed, but only abnormal results are displayed) Labs Reviewed  COMPREHENSIVE METABOLIC PANEL - Abnormal; Notable for the following:       Result Value   Potassium 3.4 (*)    Chloride 99 (*)    Glucose, Bld 120 (*)    Total Protein 8.3 (*)    All other components within normal limits  URINALYSIS, ROUTINE W REFLEX MICROSCOPIC - Abnormal; Notable for the following:    APPearance HAZY (*)    Ketones, ur 80 (*)     Protein, ur 100 (*)    Squamous Epithelial / LPF 0-5 (*)    All other components within normal limits  LIPASE, BLOOD  CBC  POC URINE PREG, ED    EKG  EKG Interpretation None       Radiology Dg Abd 2 Views  Result Date: 12/23/2016 CLINICAL DATA:  31 year old female with vomiting. EXAM: ABDOMEN - 2 VIEW COMPARISON:  Abdominal ultrasound dated 12/01/2016 and CT dated 07/03/2014 FINDINGS: The bowel gas pattern is normal. There is no evidence of free air. No radio-opaque calculi or other significant radiographic abnormality is seen. IMPRESSION: Negative. Electronically Signed   By: Elgie Collard M.D.   On: 12/23/2016 20:45    Procedures Procedures (including critical care time)  Medications Ordered in ED Medications  sodium chloride 0.9 % bolus 1,000 mL (1,000 mLs Intravenous New Bag/Given 12/23/16 1849)  metoCLOPramide (REGLAN) injection 10 mg (10 mg Intravenous Given 12/23/16 1849)  LORazepam (ATIVAN) injection 0.5 mg (0.5 mg Intravenous Given 12/23/16 1849)     Initial Impression / Assessment and Plan / ED Course  I have reviewed the triage vital signs and the nursing notes.  Pertinent labs & imaging results that were available during my care of the patient were reviewed by me and considered in my medical decision making (see chart for details).    11:19 PM Patient feels much better at this time.  Discharge home in good condition.  Symptom control.  Home with Reglan.  Ongoing GI follow-up scheduled for next week.  Patient understands return to the ER for new or worsening symptoms  Final Clinical Impressions(s) / ED Diagnoses   Final diagnoses:  Vomiting  Nausea and vomiting, intractability of vomiting not specified, unspecified vomiting type    New Prescriptions New Prescriptions   METOCLOPRAMIDE (REGLAN) 10 MG TABLET    Take 1 tablet (10 mg total) by mouth every 6 (six) hours.     Azalia Bilis, MD 12/23/16 [phone]

## 2016-12-23 NOTE — ED Notes (Signed)
Patient still not able to provide urine sample 

## 2017-01-02 ENCOUNTER — Encounter: Payer: Self-pay | Admitting: Gastroenterology
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
## 2017-01-12 ENCOUNTER — Ambulatory Visit: Payer: 59 | Admitting: Internal Medicine

## 2017-02-20 ENCOUNTER — Encounter: Payer: Self-pay | Admitting: Gastroenterology

## 2017-02-20 ENCOUNTER — Ambulatory Visit (INDEPENDENT_AMBULATORY_CARE_PROVIDER_SITE_OTHER): Payer: Medicaid Other | Admitting: Gastroenterology

## 2017-02-20 VITALS — BP 100/68 | HR 94 | Ht 69.0 in | Wt 244.0 lb

## 2017-02-20 DIAGNOSIS — G43A0 Cyclical vomiting, not intractable: Secondary | ICD-10-CM | POA: Diagnosis not present

## 2017-02-20 DIAGNOSIS — F1210 Cannabis abuse, uncomplicated: Secondary | ICD-10-CM

## 2017-02-20 DIAGNOSIS — F12988 Cannabis use, unspecified with other cannabis-induced disorder: Secondary | ICD-10-CM

## 2017-02-20 DIAGNOSIS — K219 Gastro-esophageal reflux disease without esophagitis: Secondary | ICD-10-CM

## 2017-02-20 DIAGNOSIS — R112 Nausea with vomiting, unspecified: Secondary | ICD-10-CM | POA: Diagnosis not present

## 2017-02-20 DIAGNOSIS — R12 Heartburn: Secondary | ICD-10-CM | POA: Diagnosis not present

## 2017-02-20 MED ORDER — OMEPRAZOLE 40 MG PO CPDR: 40.0000 mg | DELAYED_RELEASE_CAPSULE | Freq: Two times a day (BID) | ORAL | 2 refills | Status: DC

## 2017-02-20 MED ORDER — FAMOTIDINE 20 MG PO TABS: 20.0000 mg | ORAL_TABLET | Freq: Every day | ORAL | 2 refills | Status: DC

## 2017-02-20 MED ORDER — ONDANSETRON HCL 4 MG PO TABS: 4.0000 mg | ORAL_TABLET | Freq: Three times a day (TID) | ORAL | 3 refills | Status: DC | PRN

## 2017-02-20 NOTE — Progress Notes (Signed)
Donna Cook    [id number]    Jul 22, 1985  Primary Care Physician:Patient, No Pcp Per  Referring Physician: No referring provider defined for this encounter.  Chief complaint:  Intermittent nausea and vomiting  HPI: 31 yr F with complaints of intermittent episodes of nausea and vomiting for past 1-2 years. She is completely symptom free in between the episodes. She has tried Zofran, Reglan and Phenergan with no improvement. She has had multiple ER visits in the past 2 years for intractable cyclic nausea and vomiting. She gets extremely dehydrated and dizzy and presents to ER for IV hydration and IV medication.   Patient smokes marijuana intermittently, on weekends according to her. Urine tox screen was positive for THC and cocaine on 11/30/2016.  Patient also complains of heartburn, constant burping and regurgitation. She was on Prilosec in the past but stopped taking it 2 weeks ago after she ran out of prescription.  Denies any dysphagia, odynophagia, constipation, diarrhea, abdominal pain, melena or bright red blood per rectum.  Denies any weight loss or weight gain.  No family h/o IBD or colon cancer   Outpatient Encounter Prescriptions as of 02/20/2017  Medication Sig  . aluminum-magnesium hydroxide 200-200 MG/5ML suspension Take 15 mLs by mouth every 6 (six) hours as needed for indigestion.  . famotidine (PEPCID) 10 MG tablet Take 10 mg by mouth 2 (two) times daily as needed for heartburn or indigestion.  . metoCLOPramide (REGLAN) 10 MG tablet Take 1 tablet (10 mg total) by mouth every 6 (six) hours.  Marland Kitchen omeprazole (PRILOSEC) 20 MG capsule Take 1 capsule (20 mg total) by mouth daily.  . ondansetron (ZOFRAN ODT) 8 MG disintegrating tablet Take 1 tablet (8 mg total) by mouth every 8 (eight) hours as needed for nausea or vomiting.  . promethazine (PHENERGAN) 25 MG tablet Take 1 tablet (25 mg total) by mouth every 6 (six) hours as needed for nausea or vomiting.   No  facility-administered encounter medications on file as of 02/20/2017.     Allergies as of 02/20/2017 - Review Complete 12/23/2016  Allergen Reaction Noted  . Amoxicillin Hives 10/13/2013    No past medical history on file.  Past Surgical History:  Procedure Laterality Date  . CESAREAN SECTION    . TONSILLECTOMY      Family History  Problem Relation Age of Onset  . Diabetes Mother   . Diabetes Father   . Breast cancer Maternal Grandmother   . Diabetes Maternal Grandmother   . Clotting disorder Maternal Grandmother   . Diabetes Maternal Grandfather   . Diabetes Paternal Grandmother   . Diabetes Paternal Grandfather     Social History   Social History  . Marital status: Single    Spouse name: N/A  . Number of children: N/A  . Years of education: N/A   Occupational History  . Not on file.   Social History Main Topics  . Smoking status: Current Every Day Smoker    Packs/day: 0.50    Types: Cigarettes  . Smokeless tobacco: Never Used  . Alcohol use Yes     Comment: socially  . Drug use: Yes    Types: Marijuana     Comment: occasional, last smoked 2 weeks ago from 12/23/16  . Sexual activity: Yes    Birth control/ protection: None   Other Topics Concern  . Not on file   Social History Narrative  . No narrative on file  Review of systems: Review of Systems  Constitutional: Negative for fever and chills.  HENT: Negative.   Eyes: Negative for blurred vision.  Respiratory: Negative for cough, shortness of breath and wheezing.   Cardiovascular: Negative for chest pain and palpitations.  Gastrointestinal: as per HPI Genitourinary: Negative for dysuria, urgency, frequency and hematuria.  Musculoskeletal: Negative for myalgias, back pain and joint pain.  Skin: Negative for itching and rash.  Neurological: Negative for dizziness, tremors, focal weakness, seizures and loss of consciousness.  Endo/Heme/Allergies: Positive for seasonal allergies.    Psychiatric/Behavioral: Negative for depression, suicidal ideas and hallucinations.  All other systems reviewed and are negative.   Physical Exam: Vitals:   02/20/17 0929  BP: 100/68  Pulse: 94   Body mass index is 36.03 kg/m. Gen:      No acute distress HEENT:  EOMI, sclera anicteric Neck:     No masses; no thyromegaly Lungs:    Clear to auscultation bilaterally; normal respiratory effort CV:         Regular rate and rhythm; no murmurs Abd:      + bowel sounds; soft, non-tender; no palpable masses, no distension Ext:    No edema; adequate peripheral perfusion Skin:      Warm and dry; no rash Neuro: alert and oriented x 3 Psych: normal mood and affect  Data Reviewed:  Reviewed labs, radiology imaging, old records and pertinent past GI work up  Abdominal ultrasound: Gallbladder:  No gallstones or wall thickening visualized. No sonographic Murphy sign noted by sonographer.  Common bile duct:  Diameter: 3 mm  Liver:  No focal lesion identified. Within normal limits in parenchymal echogenicity.  IMPRESSION: Normal right upper quadrant abdominal ultrasound.  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 All questions answered. The patient agrees to proceed. Discussed with patient in detail to abstain from THC/marijuana and Cocaine She was given reading material regarding cyclic vomiting syndrome and cannabinoid hyperemesis syndrome Return as  needed    K. Scherry Ran , MD [phone] Mon-Fri 8a-5p [phone] after 5p, weekends, holidays  CC: No ref. provider found

## 2017-02-20 NOTE — Patient Instructions (Signed)
You have been scheduled for an endoscopy. Please follow written instructions given to you at your visit today. If you use inhalers (even only as needed), please bring them with you on the day of your procedure. Your physician has requested that you go to www.startemmi.com and enter the access code given to you at your visit today. This web site gives a general overview about your procedure. However, you should still follow specific instructions given to you by our office regarding your preparation for the procedure.  We have sent the following medications to your pharmacy for you to pick up at your convenience:  Omeprazole Pepcid Zofran  We have given you a handout about cyclic vomiting.  We have given you a work note for today's visit.

## 2017-03-21 ENCOUNTER — Encounter: Payer: Self-pay | Admitting: Gastroenterology

## 2017-03-21 ENCOUNTER — Ambulatory Visit (AMBULATORY_SURGERY_CENTER): Payer: 59 | Admitting: Gastroenterology

## 2017-03-21 VITALS — BP 124/85 | HR 63 | Temp 98.6°F | Resp 15 | Ht 69.0 in | Wt 244.0 lb

## 2017-03-21 DIAGNOSIS — R1115 Cyclical vomiting syndrome unrelated to migraine: Secondary | ICD-10-CM

## 2017-03-21 DIAGNOSIS — G43A0 Cyclical vomiting, not intractable: Secondary | ICD-10-CM | POA: Diagnosis not present

## 2017-03-21 MED ORDER — SODIUM CHLORIDE 0.9 % IV SOLN: 500.0000 mL | INTRAVENOUS | Status: DC

## 2017-03-21 NOTE — Progress Notes (Signed)
Called to room to assist during endoscopic procedure.  Patient ID and intended procedure confirmed with present staff. Received instructions for my participation in the procedure from the performing physician.  

## 2017-03-21 NOTE — Op Note (Signed)
Donna Cook Patient Name: Donna Cook Procedure Date: 03/21/2017 10:28 AM MRN: [id number] Endoscopist: Napoleon Form , MD Age: 31 Referring MD:  Date of Birth: [birth year] Gender: Female Account #: [id number] Procedure:                Upper GI endoscopy Indications:              Persistent vomiting of unknown cause Medicines:                Monitored Anesthesia Care Procedure:                Pre-Anesthesia Assessment:                           - Prior to the procedure, a History and Physical                            was performed, and patient medications and                            allergies were reviewed. The patient's tolerance of                            previous anesthesia was also reviewed. The risks                            and benefits of the procedure and the sedation                            options and risks were discussed with the patient.                            All questions were answered, and informed consent                            was obtained. Prior Anticoagulants: The patient has                            taken no previous anticoagulant or antiplatelet                            agents. ASA Grade Assessment: II - A patient with                            mild systemic disease. After reviewing the risks                            and benefits, the patient was deemed in                            satisfactory condition to undergo the procedure.                           After obtaining informed consent, the endoscope was  passed under direct vision. Throughout the                            procedure, the patient's blood pressure, pulse, and                            oxygen saturations were monitored continuously. The                            Endoscope was introduced through the mouth, and                            advanced to the second part of duodenum. The upper                            GI endoscopy  was accomplished without difficulty.                            The patient tolerated the procedure well. Scope In: Scope Out: Findings:                 The esophagus was normal.                           The cardia and gastric fundus were normal on                            retroflexion. Biopsies were taken with a cold                            forceps for Helicobacter pylori testing using                            CLOtest.                           The examined duodenum was normal. Complications:            No immediate complications. Estimated Blood Loss:     Estimated blood loss was minimal. Impression:               - Normal esophagus.                           - Normal examined duodenum. Recommendation:           - Patient has a contact number available for                            emergencies. The signs and symptoms of potential                            delayed complications were discussed with the                            patient. Return to normal activities tomorrow.  Written discharge instructions were provided to the                            patient.                           - Resume previous diet.                           - Continue present medications.                           - Await pathology results. Napoleon Form, MD 03/21/2017 10:53:16 AM This report has been signed electronically.

## 2017-03-21 NOTE — Patient Instructions (Signed)
Handout given on gastritis.    YOU HAD AN ENDOSCOPIC PROCEDURE TODAY AT THE [facility]:   Refer to the procedure report that was given to you for any specific questions about what was found during the examination.  If the procedure report does not answer your questions, please call your gastroenterologist to clarify.  If you requested that your care partner not be given the details of your procedure findings, then the procedure report has been included in a sealed envelope for you to review at your convenience later.  YOU SHOULD EXPECT: Some feelings of bloating in the abdomen. Passage of more gas than usual.  Walking can help get rid of the air that was put into your GI tract during the procedure and reduce the bloating. If you had a lower endoscopy (such as a colonoscopy or flexible sigmoidoscopy) you may notice spotting of blood in your stool or on the toilet paper. If you underwent a bowel prep for your procedure, you may not have a normal bowel movement for a few days.  Please Note:  You might notice some irritation and congestion in your nose or some drainage.  This is from the oxygen used during your procedure.  There is no need for concern and it should clear up in a day or so.  SYMPTOMS TO REPORT IMMEDIATELY:   Following upper endoscopy (EGD)  Vomiting of blood or coffee ground material  New chest pain or pain under the shoulder blades  Painful or persistently difficult swallowing  New shortness of breath  Fever of 100F or higher  Black, tarry-looking stools  For urgent or emergent issues, a gastroenterologist can be reached at any hour by calling [phone].   DIET:  We do recommend a small meal at first, but then you may proceed to your regular diet.  Drink plenty of fluids but you should avoid alcoholic beverages for 24 hours.  ACTIVITY:  You should plan to take it easy for the rest of today and you should NOT DRIVE or use heavy machinery until tomorrow  (because of the sedation medicines used during the test).    FOLLOW UP: Our staff will call the number listed on your records the next business day following your procedure to check on you and address any questions or concerns that you may have regarding the information given to you following your procedure. If we do not reach you, we will leave a message.  However, if you are feeling well and you are not experiencing any problems, there is no need to return our call.  We will assume that you have returned to your regular daily activities without incident.  If any biopsies were taken you will be contacted by phone or by letter within the next 1-3 weeks.  Please call us at [phone] if you have not heard about the biopsies in 3 weeks.    SIGNATURES/CONFIDENTIALITY: You and/or your care partner have signed paperwork which will be entered into your electronic medical record.  These signatures attest to the fact that that the information above on your After Visit Summary has been reviewed and is understood.  Full responsibility of the confidentiality of this discharge information lies with you and/or your care-partner. 

## 2017-03-21 NOTE — Progress Notes (Signed)
Pt's states no medical or surgical changes since previsit or office visit. 

## 2017-03-21 NOTE — Progress Notes (Signed)
Report to PACU, RN, vss, BBS= Clear.  

## 2017-03-22 ENCOUNTER — Telehealth: Payer: Self-pay | Admitting: *Deleted

## 2017-03-22 ENCOUNTER — Encounter: Payer: Self-pay | Admitting: Gastroenterology

## 2017-03-22 LAB — HELICOBACTER PYLORI SCREEN-BIOPSY: UREASE: NEGATIVE

## 2017-03-22 NOTE — Telephone Encounter (Signed)
  Follow up Call-  Call back number 03/21/2017  Post procedure Call Back phone  # [phone]  Permission to leave phone message Yes  Some recent data might be hidden     Patient questions:  Do you have a fever, pain , or abdominal swelling? No. Pain Score  0 *  Have you tolerated food without any problems? Yes.    Have you been able to return to your normal activities? Yes.    Do you have any questions about your discharge instructions: Diet   No. Medications  No. Follow up visit  No.  Do you have questions or concerns about your Care? No.  Actions: * If pain score is 4 or above: No action needed, pain <4.

## 2017-08-11 ENCOUNTER — Encounter (HOSPITAL_COMMUNITY): Payer: Self-pay | Admitting: Emergency Medicine

## 2017-08-11 ENCOUNTER — Ambulatory Visit (HOSPITAL_COMMUNITY)
Admission: EM | Admit: 2017-08-11 | Discharge: 2017-08-11 | Disposition: A | Discharge status: Home / Self Care | Payer: BLUE CROSS/BLUE SHIELD | Attending: Internal Medicine | Admitting: Internal Medicine

## 2017-08-11 DIAGNOSIS — R112 Nausea with vomiting, unspecified: Secondary | ICD-10-CM | POA: Diagnosis not present

## 2017-08-11 DIAGNOSIS — R197 Diarrhea, unspecified: Secondary | ICD-10-CM | POA: Diagnosis not present

## 2017-08-11 LAB — POCT URINALYSIS DIP (DEVICE)
BILIRUBIN URINE: NEGATIVE
GLUCOSE, UA: NEGATIVE mg/dL
HGB URINE DIPSTICK: NEGATIVE
LEUKOCYTES UA: NEGATIVE
NITRITE: NEGATIVE
Protein, ur: NEGATIVE mg/dL
Specific Gravity, Urine: >=1.03 (ref 1.005–1.030)
UROBILINOGEN UA: 0.2 mg/dL (ref 0.0–1.0)
pH: 6 (ref 5.0–8.0)

## 2017-08-11 MED ORDER — ONDANSETRON HCL 4 MG PO TABS: 4.0000 mg | ORAL_TABLET | Freq: Three times a day (TID) | ORAL | 0 refills | Status: DC | PRN

## 2017-08-11 MED ORDER — LOPERAMIDE HCL 2 MG PO CAPS: ORAL_CAPSULE | ORAL | 0 refills | Status: DC

## 2017-08-11 NOTE — ED Triage Notes (Signed)
PT C/O: abd pain onset 1 week  Associated w/vomiting and diarrhea   DENIES:  fevers  TAKING MEDS: Pepto bismol   A&O x4... NAD... Ambulatory

## 2017-08-11 NOTE — ED Provider Notes (Signed)
[facility]    CSN: [id number] Arrival date & time: 08/11/17  1616     History   Chief Complaint Chief Complaint  Patient presents with  . Abdominal Pain    HPI Donna Cook is a 32 y.o. female.   Donna Cook presents with complaints of nausea, vomiting and diarrhea which has been ongoing for the past week. States she has diarrhea which is stool and liquid after she eats anything. Today has had 4 loose stools. Denies abdominal pain. Used zofran yesterday which did help. History of vomiting, endoscopy performed in September 2018 without acute findings. Was started on omeprazole, pepcid and zofran. Does not take omeprazole or pepcid. Has not followed with GI since. Urinating. Without blood to vomit or diarrhea. No fevers. States she has had ill coworkers. LMP 2 weeks ago. Still is eating and drinking. Without dizziness. No recent travel, no recent antibiotic use. Smokes approximately 3 cigarettes a day.     ROS per HPI.       History reviewed. No pertinent past medical history.  There are no active problems to display for this patient.   Past Surgical History:  Procedure Laterality Date  . CESAREAN SECTION    . TONSILLECTOMY      OB History    Gravida Para Term Preterm AB Living   2       1 1    SAB TAB Ectopic Multiple Live Births                   Home Medications    Prior to Admission medications   Medication Sig Start Date End Date Taking? Authorizing Provider  famotidine (PEPCID) 20 MG tablet Take 1 tablet (20 mg total) by mouth at bedtime. 02/20/17   Napoleon Form, MD  loperamide (IMODIUM) 2 MG capsule 4mg  orally followed by 2mg  after each loose stool. Up to three times a day 08/11/17   Linus Mako B, NP  omeprazole (PRILOSEC) 40 MG capsule Take 1 capsule (40 mg total) by mouth 2 (two) times daily. Before breakfast and dinner 02/20/17   Napoleon Form, MD  ondansetron (ZOFRAN) 4 MG tablet Take 1 tablet (4 mg total) by mouth every 8 (eight)  hours as needed for nausea or vomiting. 08/11/17   Georgetta Haber, NP  fluticasone (FLONASE) 50 MCG/ACT nasal spray Place 2 sprays into both nostrils daily. Patient not taking: Reported on 04/01/2015 10/16/14 11/08/15  Pisciotta, Mardella Layman    Family History Family History  Problem Relation Age of Onset  . Diabetes Mother   . Diabetes Father   . Breast cancer Maternal Grandmother   . Diabetes Maternal Grandmother   . Clotting disorder Maternal Grandmother   . Diabetes Maternal Grandfather   . Diabetes Paternal Grandmother   . Diabetes Paternal Grandfather     Social History Social History   Tobacco Use  . Smoking status: Current Every Day Smoker    Packs/day: 0.50    Types: Cigarettes  . Smokeless tobacco: Never Used  Substance Use Topics  . Alcohol use: Yes    Comment: socially  . Drug use: Yes    Types: Marijuana    Comment: occasional, last smoked 2 weeks ago from 12/23/16     Allergies   Amoxicillin   Review of Systems Review of Systems   Physical Exam Triage Vital Signs ED Triage Vitals [08/11/17 1757]  Enc Vitals Group     BP 123/72     Pulse Rate 69  Resp 16     Temp 98.4 F (36.9 C)     Temp Source Oral     SpO2 100 %     Weight      Height      Head Circumference      Peak Flow      Pain Score      Pain Loc      Pain Edu?      Excl. in GC?    No data found.  Updated Vital Signs BP 123/72 (BP Location: Left Arm)   Pulse 69   Temp 98.4 F (36.9 C) (Oral)   Resp 16   LMP 07/14/2017   SpO2 100%   Visual Acuity Right Eye Distance:   Left Eye Distance:   Bilateral Distance:    Right Eye Near:   Left Eye Near:    Bilateral Near:     Physical Exam  Constitutional: She is oriented to person, place, and time. She appears well-developed and well-nourished. No distress.  Cardiovascular: Normal rate, regular rhythm and normal heart sounds.  Pulmonary/Chest: Effort normal and breath sounds normal.  Abdominal: Soft. Bowel sounds are  normal. There is tenderness in the suprapubic area. There is no rigidity, no rebound, no guarding, no CVA tenderness, no tenderness at McBurney's point and negative Murphy's sign.  Very mild suprapubic tenderness  Neurological: She is alert and oriented to person, place, and time.  Skin: Skin is warm and dry.     UC Treatments / Results  Labs (all labs ordered are listed, but only abnormal results are displayed) Labs Reviewed  POCT URINALYSIS DIP (DEVICE) - Abnormal; Notable for the following components:      Result Value   Ketones, ur TRACE (*)    All other components within normal limits    EKG  EKG Interpretation None       Radiology No results found.  Procedures Procedures (including critical care time)  Medications Ordered in UC Medications - No data to display   Initial Impression / Assessment and Plan / UC Course  I have reviewed the triage vital signs and the nursing notes.  Pertinent labs & imaging results that were available during my care of the patient were reviewed by me and considered in my medical decision making (see chart for details).     Non specific abdominal exam findings. Non toxic in appearance. Vitals stable. Without complaints of pain. zofran as needed. Imodium for diarrhea. Return precautions provided. Encouraged follow up with gi for further evaluation and treatment. Patient verbalized understanding and agreeable to plan.    Final Clinical Impressions(s) / UC Diagnoses   Final diagnoses:  Nausea vomiting and diarrhea    ED Discharge Orders        Ordered    ondansetron (ZOFRAN) 4 MG tablet  Every 8 hours PRN     08/11/17 1905    loperamide (IMODIUM) 2 MG capsule     08/11/17 1905       Controlled Substance Prescriptions [place] Controlled Substance Registry consulted? Not Applicable   Georgetta HaberBurky, Marlin Brys B, NP 08/11/17 1911

## 2017-08-11 NOTE — Discharge Instructions (Signed)
Increase your fluid intake. Zofran as needed for nausea. Imodium as needed for diarrhea.  If develop worsening abdominal pain, fevers, dehydration please visit ed. Please schedule an appointment for next week with your GI doctor for recheck.

## 2018-02-23 IMAGING — CR DG ABDOMEN 2V
2 series · 2 of 2 positions shown · non-contrast
Comparison: Abdominal ultrasound dated 12/01/2016 and CT dated
07/03/2014

CLINICAL DATA: 31-year-old female with vomiting.

EXAM:
ABDOMEN - 2 VIEW

[w abdomen upright]
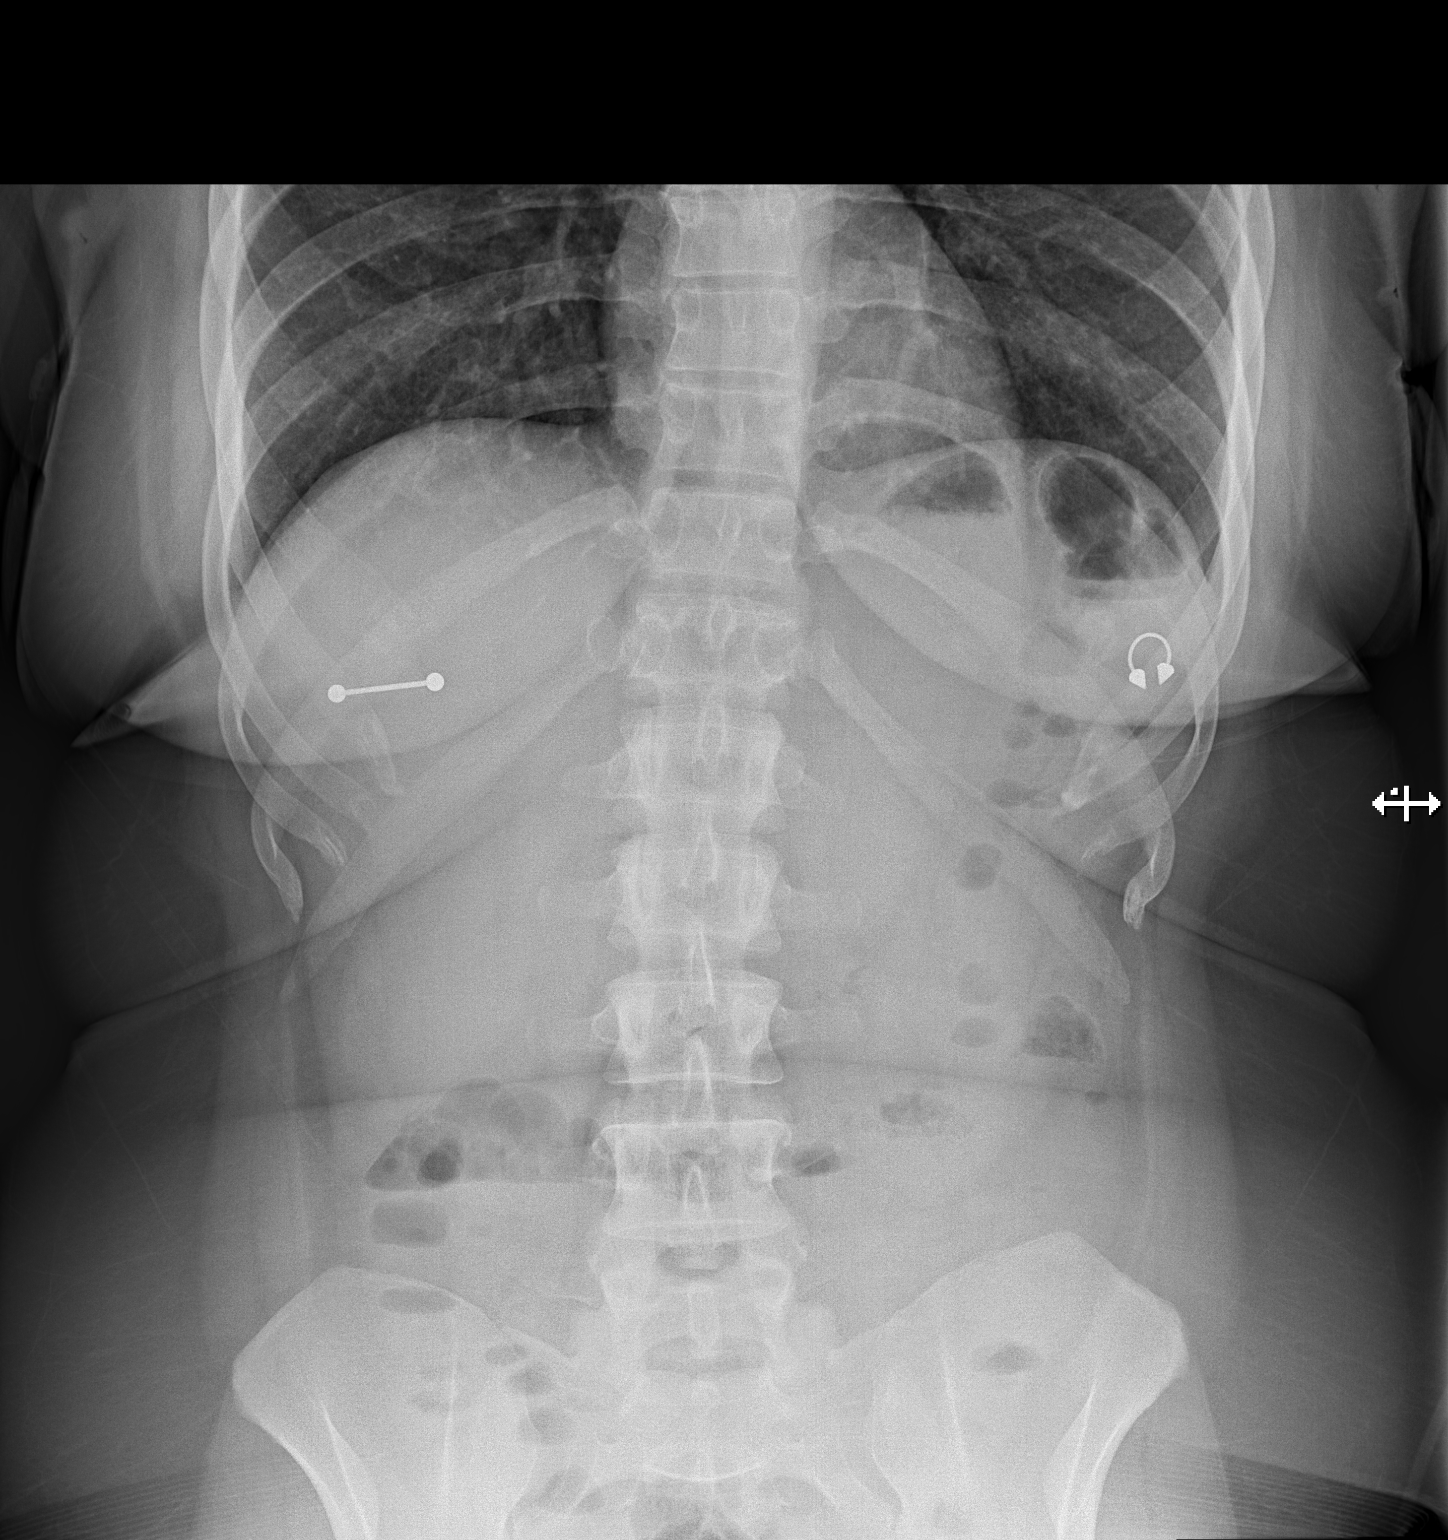

[t abdomen supine]
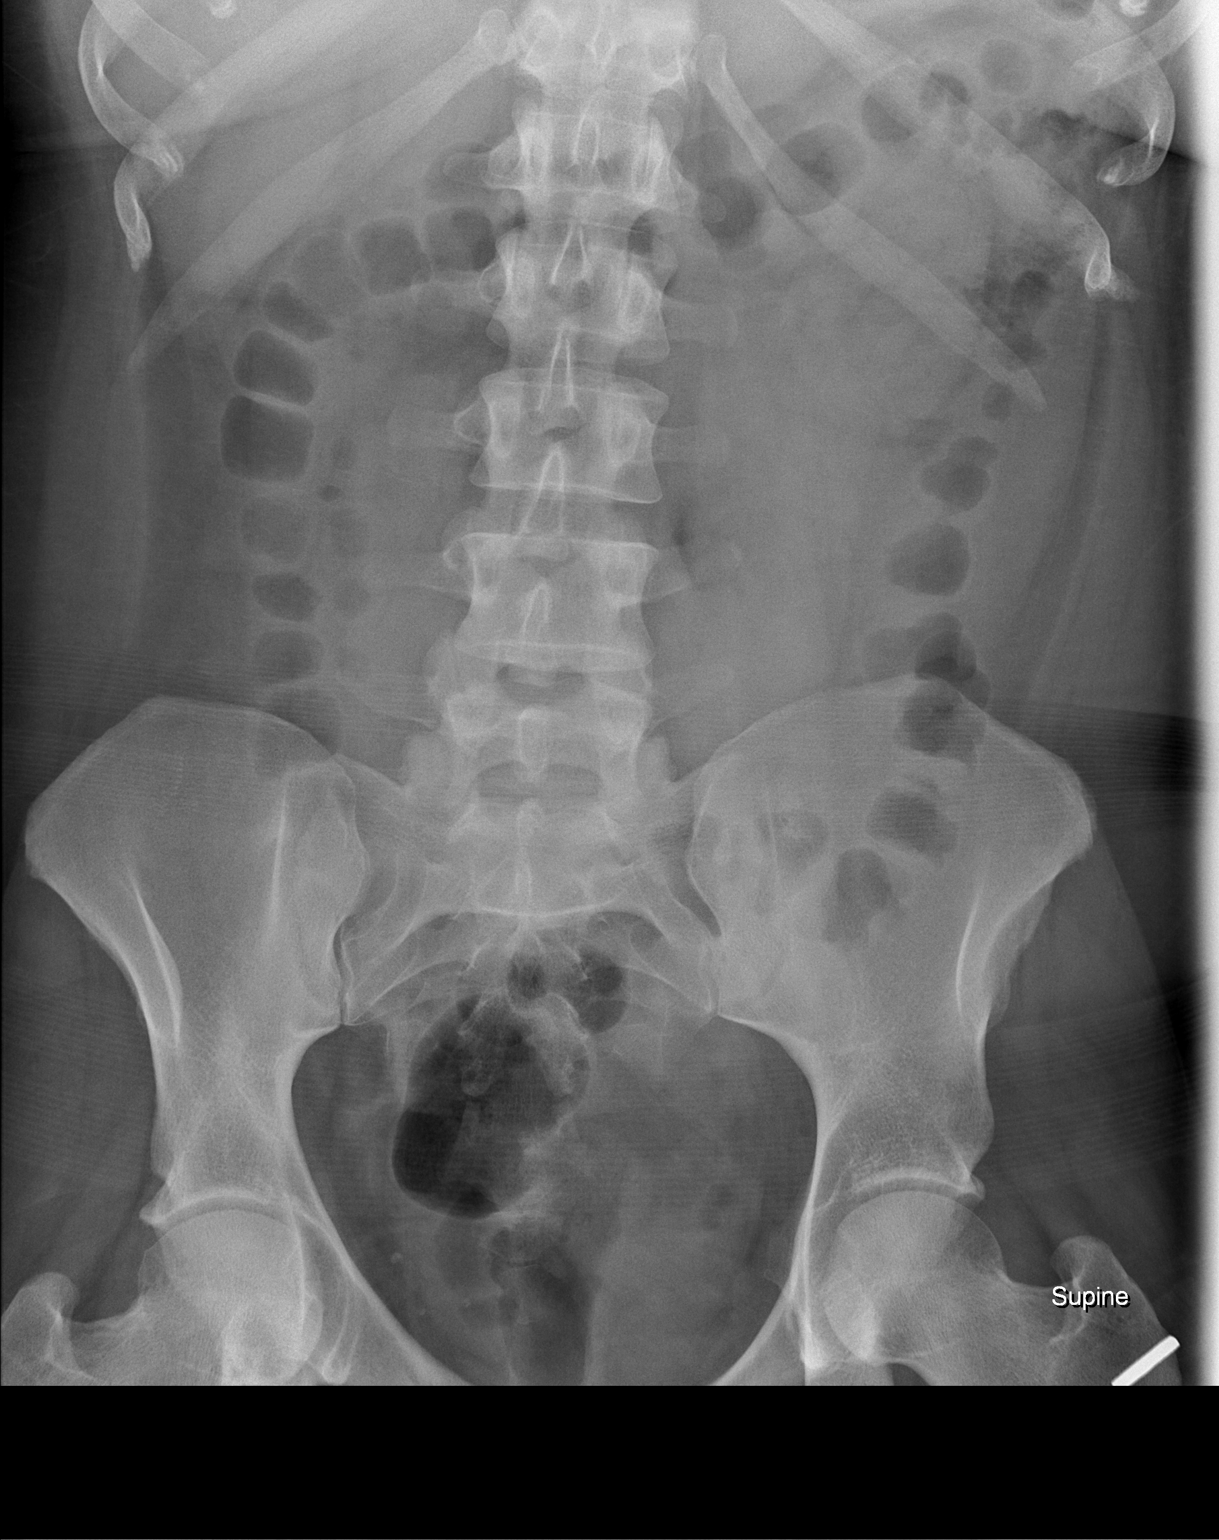

[2 of 2 positions shown; findings below may reference images not displayed]

FINDINGS: The bowel gas pattern is normal. There is no evidence of free air.
No radio-opaque calculi or other significant radiographic
abnormality is seen.
IMPRESSION: Negative.

## 2018-06-13 ENCOUNTER — Other Ambulatory Visit: Payer: Self-pay

## 2018-06-13 ENCOUNTER — Ambulatory Visit (HOSPITAL_COMMUNITY)
Admission: EM | Admit: 2018-06-13 | Discharge: 2018-06-13 | Disposition: A | Discharge status: Home / Self Care | Payer: Medicaid Other | Attending: Family Medicine | Admitting: Family Medicine

## 2018-06-13 ENCOUNTER — Encounter (HOSPITAL_COMMUNITY): Payer: Self-pay | Admitting: Emergency Medicine

## 2018-06-13 DIAGNOSIS — B349 Viral infection, unspecified: Secondary | ICD-10-CM | POA: Diagnosis not present

## 2018-06-13 DIAGNOSIS — J3489 Other specified disorders of nose and nasal sinuses: Secondary | ICD-10-CM

## 2018-06-13 MED ORDER — PREDNISONE 50 MG PO TABS: 50.0000 mg | ORAL_TABLET | Freq: Every day | ORAL | 0 refills | Status: DC

## 2018-06-13 MED ORDER — IPRATROPIUM BROMIDE 0.06 % NA SOLN: 2.0000 | Freq: Four times a day (QID) | NASAL | 0 refills | Status: DC

## 2018-06-13 MED ORDER — FLUTICASONE PROPIONATE 50 MCG/ACT NA SUSP: 2.0000 | Freq: Every day | NASAL | 0 refills | Status: DC

## 2018-06-13 NOTE — ED Triage Notes (Signed)
Onset yesterday morning with a headache. Today, headache is worse and neck hurts.  Patient reports runny nose, cough.  Patient is having cold chills

## 2018-06-13 NOTE — ED Provider Notes (Addendum)
[facility]    CSN: [id number] Arrival date & time: 06/13/18  1455     History   Chief Complaint Chief Complaint  Patient presents with  . URI    HPI Donna Cook is a 32 y.o. female.   32 year old female comes in for 2 day history of URI symptoms. Has had rhinorrhea, nasal congestion, cough. Left neck pain, and headache. Denies fever, chills, night sweats. Denies nausea/vomiting. Headache is to the left frontal region. Denies neck stiffness, decrease ROM. No obvious sick contact. Current every day smoker. Has been taking ibuprofen for the headache.      History reviewed. No pertinent past medical history.  There are no active problems to display for this patient.   Past Surgical History:  Procedure Laterality Date  . CESAREAN SECTION    . TONSILLECTOMY      OB History    Gravida  2   Para      Term      Preterm      AB  1   Living  1     SAB      TAB      Ectopic      Multiple      Live Births               Home Medications    Prior to Admission medications   Medication Sig Start Date End Date Taking? Authorizing Provider  fluticasone (FLONASE) 50 MCG/ACT nasal spray Place 2 sprays into both nostrils daily. 06/13/18   Cathie HoopsYu, Charlotte Brafford V, PA-C  ipratropium (ATROVENT) 0.06 % nasal spray Place 2 sprays into both nostrils 4 (four) times daily. 06/13/18   Cathie HoopsYu, Assad Harbeson V, PA-C  predniSONE (DELTASONE) 50 MG tablet Take 1 tablet (50 mg total) by mouth daily. 06/13/18   Belinda FisherYu, Sanaii Caporaso V, PA-C    Family History Family History  Problem Relation Age of Onset  . Diabetes Mother   . Diabetes Father   . Breast cancer Maternal Grandmother   . Diabetes Maternal Grandmother   . Clotting disorder Maternal Grandmother   . Diabetes Maternal Grandfather   . Diabetes Paternal Grandmother   . Diabetes Paternal Grandfather     Social History Social History   Tobacco Use  . Smoking status: Current Every Day Smoker    Packs/day: 0.50    Types: Cigarettes  .  Smokeless tobacco: Never Used  Substance Use Topics  . Alcohol use: Yes    Comment: socially  . Drug use: Yes    Types: Marijuana    Comment: occasional, last smoked 2 weeks ago from 12/23/16     Allergies   Amoxicillin   Review of Systems Review of Systems  Reason unable to perform ROS: See HPI as above.     Physical Exam Triage Vital Signs ED Triage Vitals  Enc Vitals Group     BP 06/13/18 1606 (!) 141/80     Pulse Rate 06/13/18 1606 81     Resp 06/13/18 1606 18     Temp 06/13/18 1606 98.7 F (37.1 C)     Temp Source 06/13/18 1606 Oral     SpO2 06/13/18 1606 100 %     Weight --      Height --      Head Circumference --      Peak Flow --      Pain Score 06/13/18 1604 7     Pain Loc --      Pain Edu? --  Excl. in GC? --    No data found.  Updated Vital Signs BP (!) 141/80 (BP Location: Right Arm) Comment (BP Location): large cuff  Pulse 81   Temp 98.7 F (37.1 C) (Oral)   Resp 18   SpO2 100%   Physical Exam  Constitutional: She is oriented to person, place, and time. She appears well-developed and well-nourished. No distress.  HENT:  Head: Normocephalic and atraumatic.  Right Ear: Tympanic membrane, external ear and ear canal normal. Tympanic membrane is not erythematous and not bulging.  Left Ear: Tympanic membrane, external ear and ear canal normal. Tympanic membrane is not erythematous and not bulging.  Nose: Mucosal edema present. Right sinus exhibits maxillary sinus tenderness and frontal sinus tenderness. Left sinus exhibits maxillary sinus tenderness and frontal sinus tenderness.  Mouth/Throat: Uvula is midline, oropharynx is clear and moist and mucous membranes are normal.  Eyes: Pupils are equal, round, and reactive to light. Conjunctivae and EOM are normal.  Neck: Normal range of motion. Neck supple.  Cardiovascular: Normal rate, regular rhythm and normal heart sounds. Exam reveals no gallop and no friction rub.  No murmur  heard. Pulmonary/Chest: Effort normal and breath sounds normal. No stridor. No respiratory distress. She has no decreased breath sounds. She has no wheezes. She has no rhonchi. She has no rales.  Lymphadenopathy:       Head (right side): Submandibular adenopathy present.       Head (left side): Submandibular adenopathy present.  Neurological: She is alert and oriented to person, place, and time.  Skin: Skin is warm and dry.  Psychiatric: She has a normal mood and affect. Her behavior is normal. Judgment normal.     UC Treatments / Results  Labs (all labs ordered are listed, but only abnormal results are displayed) Labs Reviewed - No data to display  EKG None  Radiology No results found.  Procedures Procedures (including critical care time)  Medications Ordered in UC Medications - No data to display  Initial Impression / Assessment and Plan / UC Course  I have reviewed the triage vital signs and the nursing notes.  Pertinent labs & imaging results that were available during my care of the patient were reviewed by me and considered in my medical decision making (see chart for details).    Discussed with patient history and exam most consistent with viral URI. Will provide prednisone for cough and sinus pressure. Other symptomatic treatment as needed. Push fluids. Return precautions given.   Final Clinical Impressions(s) / UC Diagnoses   Final diagnoses:  Viral syndrome  Sinus pressure    ED Prescriptions    Medication Sig Dispense Auth. Provider   predniSONE (DELTASONE) 50 MG tablet Take 1 tablet (50 mg total) by mouth daily. 5 tablet Manna Gose V, PA-C   fluticasone (FLONASE) 50 MCG/ACT nasal spray Place 2 sprays into both nostrils daily. 1 g Shigeo Baugh V, PA-C   ipratropium (ATROVENT) 0.06 % nasal spray Place 2 sprays into both nostrils 4 (four) times daily. 15 mL Threasa Alpha, PA-C 06/13/18 1708    Belinda Fisher, PA-C 06/13/18 1708

## 2018-06-13 NOTE — ED Notes (Signed)
Patient has to check on child getting off bus, will return to department

## 2018-06-13 NOTE — Discharge Instructions (Addendum)
Prednisone for cough and sinus pressure. Start flonase, atrovent nasal spray for nasal congestion/drainage. You can use over the counter nasal saline rinse such as neti pot for nasal congestion. Keep hydrated, your urine should be clear to pale yellow in color. Tylenol/motrin for fever and pain. Monitor for any worsening of symptoms, chest pain, shortness of breath, wheezing, swelling of the throat, follow up for reevaluation.  ° °For sore throat/cough try using a honey-based tea. Use 3 teaspoons of honey with juice squeezed from half lemon. Place shaved pieces of ginger into 1/2-1 cup of water and warm over stove top. Then mix the ingredients and repeat every 4 hours as needed. ° °

## 2019-01-25 IMAGING — US US ABDOMEN LIMITED
1 series · 14 of 25 positions shown · non-contrast
Comparison: 07/24/2014 abdominal ultrasound

CLINICAL DATA: Right upper quadrant pain

EXAM:
US ABDOMEN LIMITED - RIGHT UPPER QUADRANT

[Series 1: us abdomen limited · 0.19mm/px · 14 of 41 slices shown]
[im 1/41]
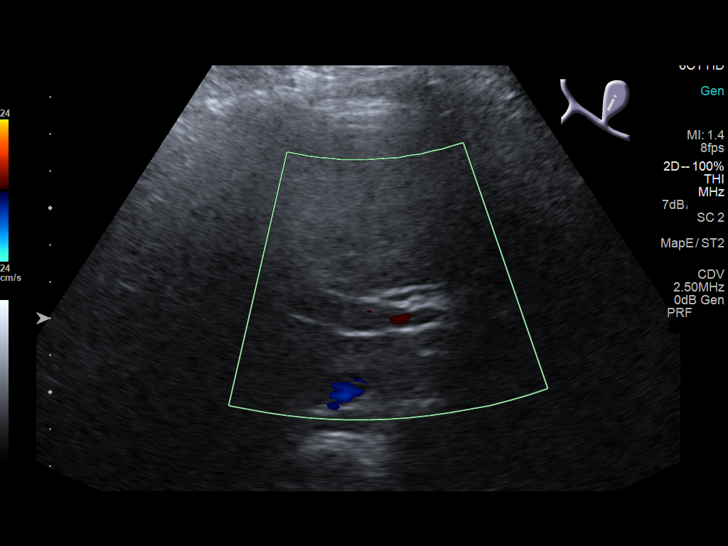
[im 4/41]
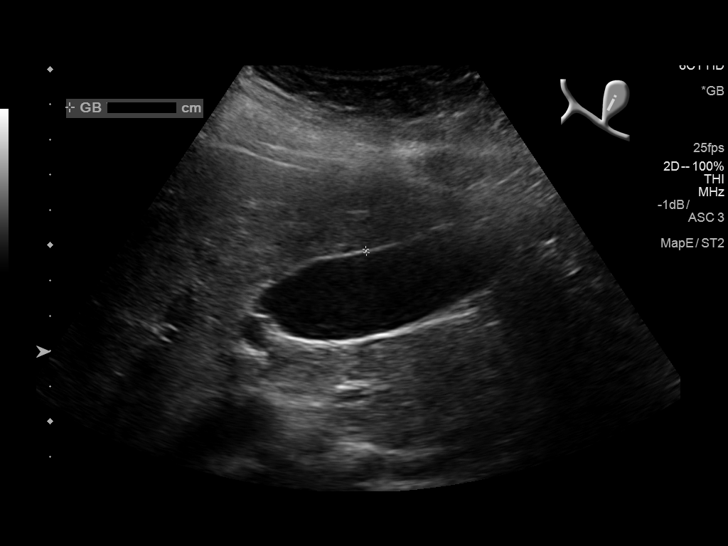
[im 7/41]
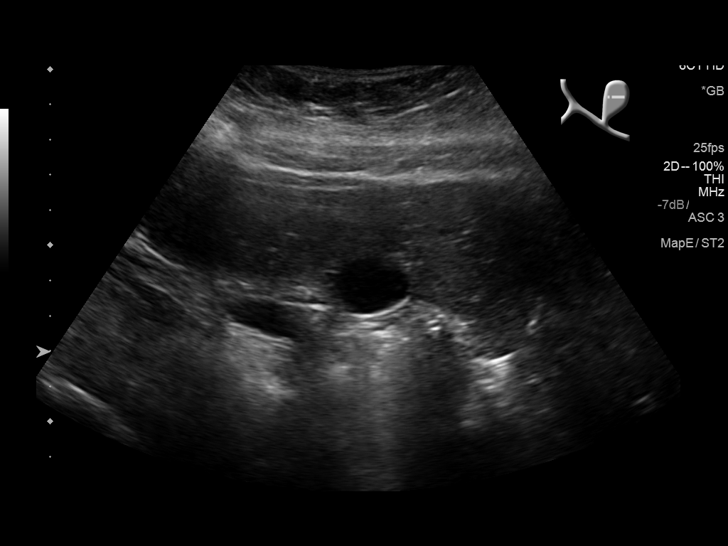
[im 11/41]
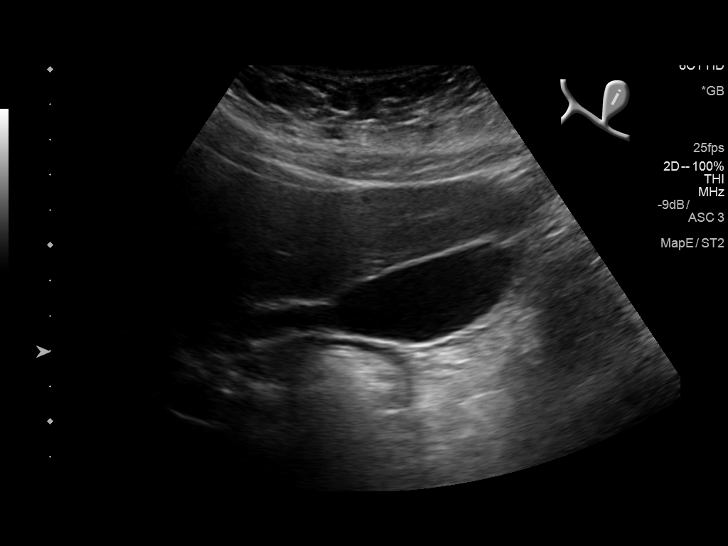
[im 14/41]
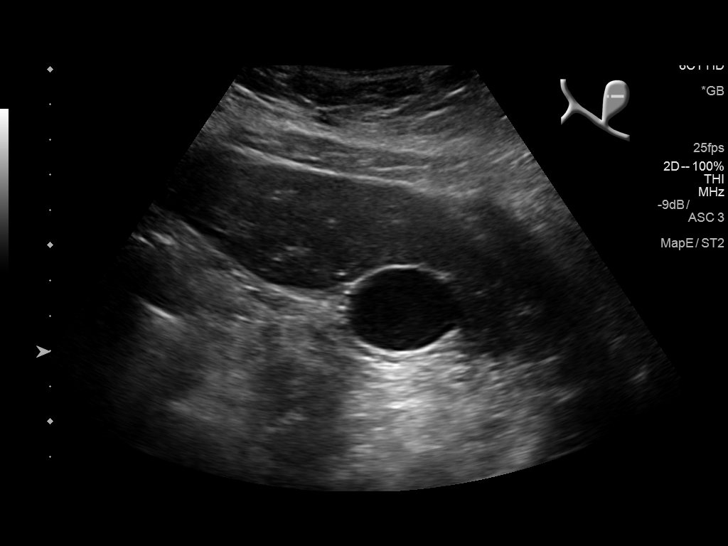
[im 16/41]
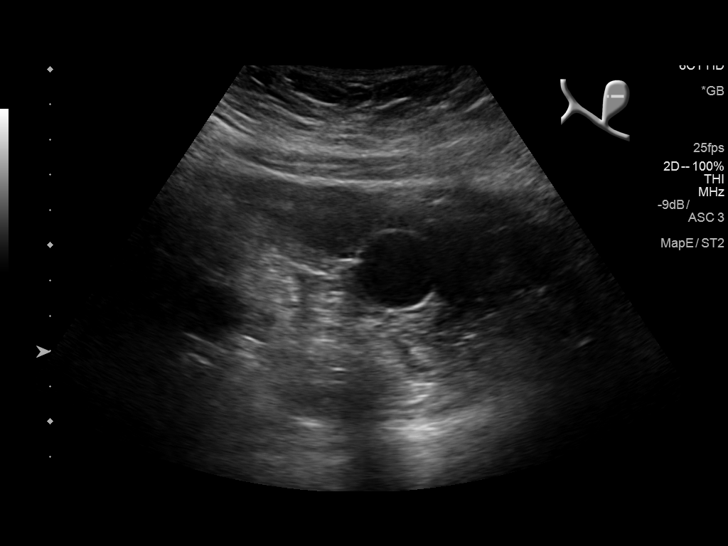
[im 19/41]
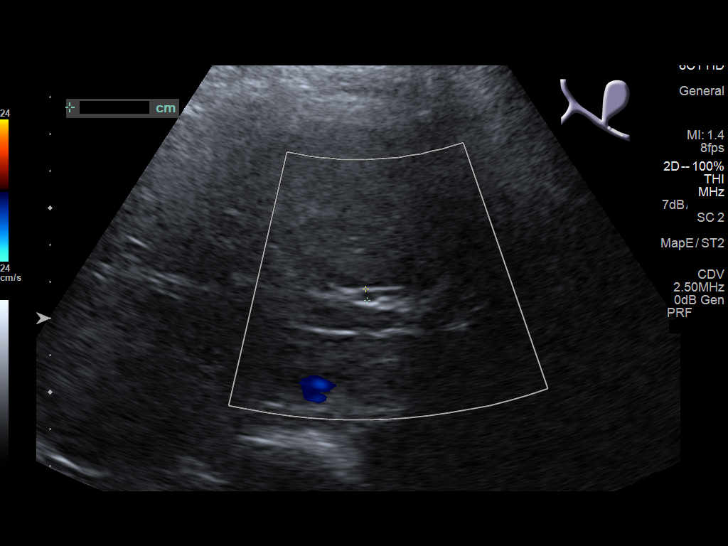
[im 22/41]
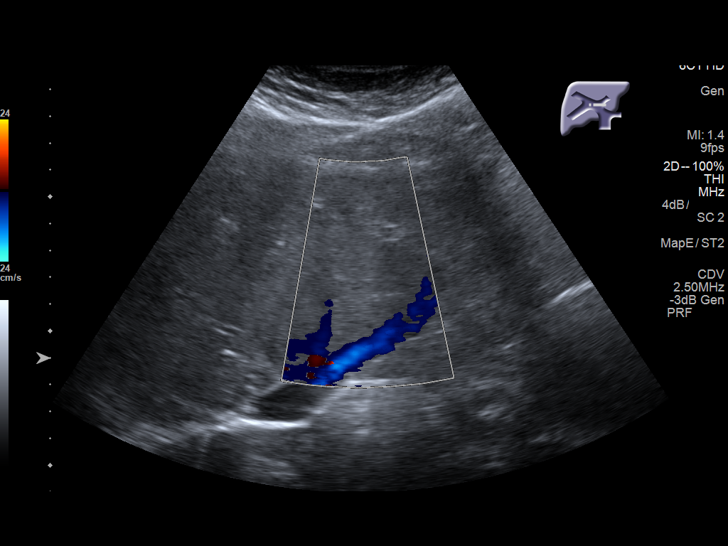
[im 26/41]
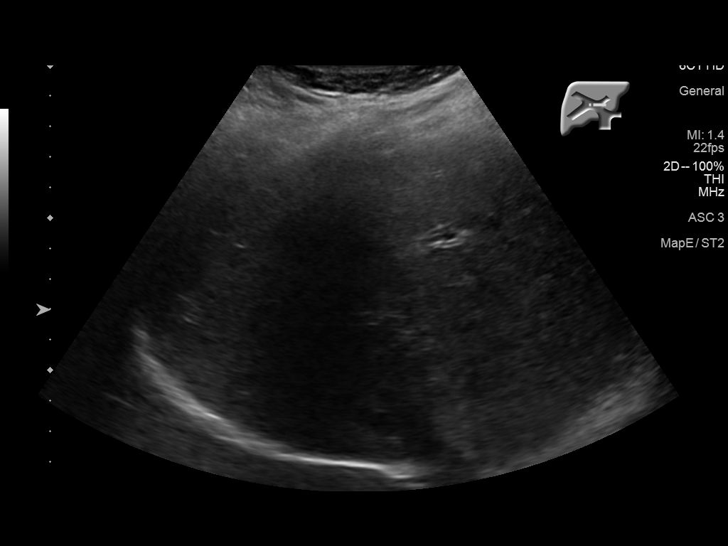
[im 27/41]
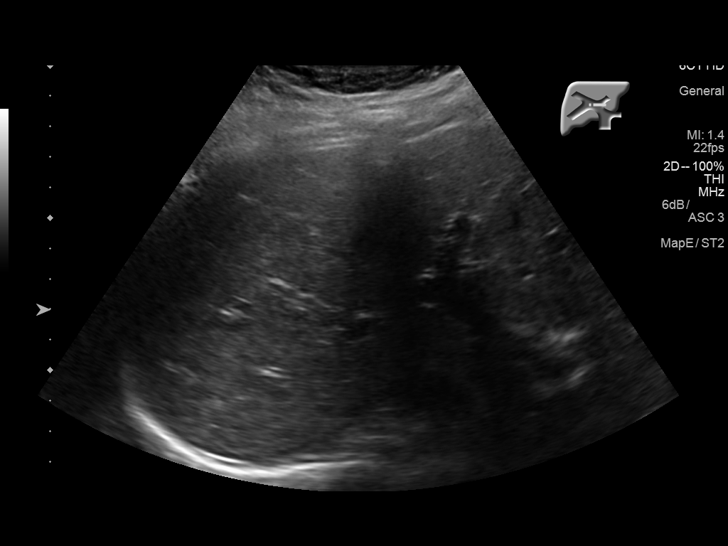
[im 31/41]
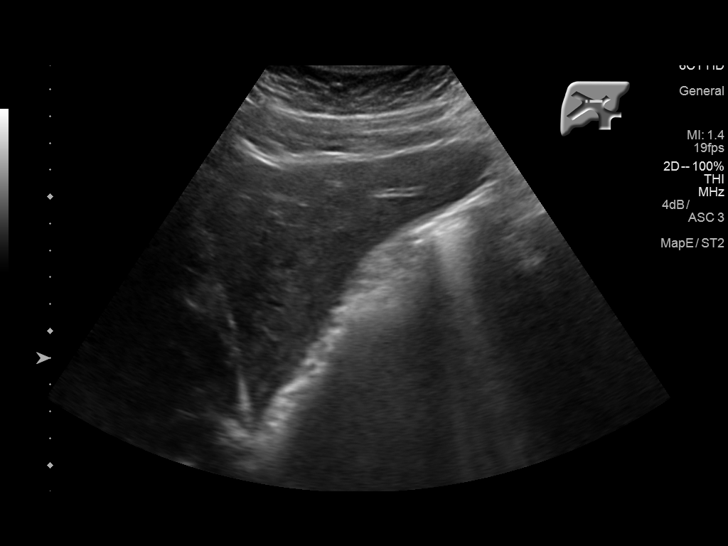
[im 34/41]
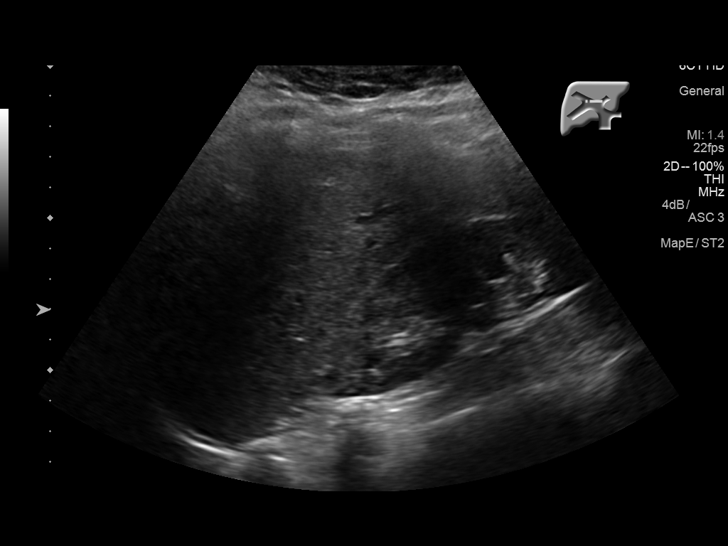
[im 37/41]
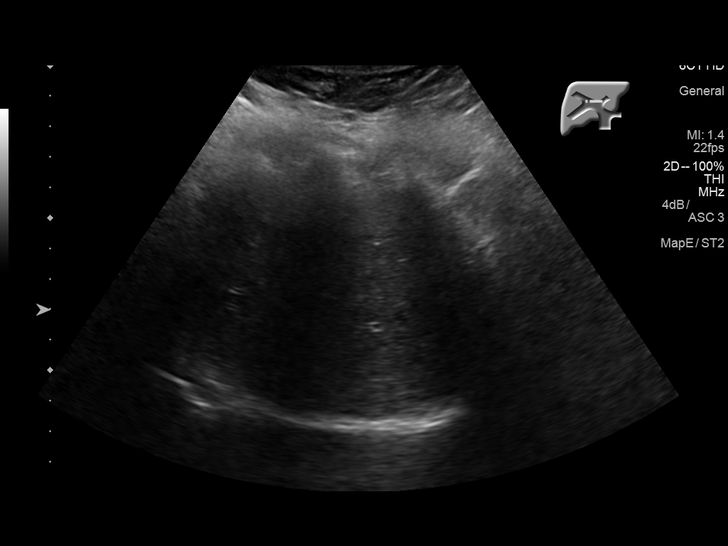
[im 41/41]
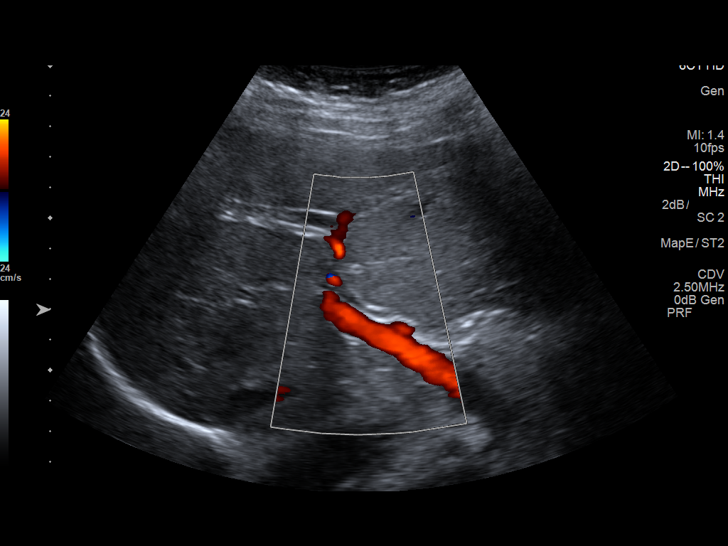

[14 of 25 positions shown; findings below may reference images not displayed]

FINDINGS: Gallbladder:

No gallstones or wall thickening visualized. No sonographic Murphy
sign noted by sonographer.

Common bile duct:

Diameter: 3 mm

Liver:

No focal lesion identified. Within normal limits in parenchymal
echogenicity.
IMPRESSION: Normal right upper quadrant abdominal ultrasound.

## 2019-02-04 ENCOUNTER — Other Ambulatory Visit: Payer: Self-pay

## 2019-02-04 ENCOUNTER — Encounter (HOSPITAL_COMMUNITY): Payer: Self-pay | Admitting: *Deleted

## 2019-02-04 ENCOUNTER — Emergency Department (HOSPITAL_COMMUNITY)
Admission: EM | Admit: 2019-02-04 | Discharge: 2019-02-04 | Disposition: A | Discharge status: Home / Self Care | Payer: Medicaid Other | Attending: Emergency Medicine | Admitting: Emergency Medicine

## 2019-02-04 DIAGNOSIS — R450 Nervousness: Secondary | ICD-10-CM | POA: Diagnosis not present

## 2019-02-04 DIAGNOSIS — R1115 Cyclical vomiting syndrome unrelated to migraine: Secondary | ICD-10-CM | POA: Diagnosis not present

## 2019-02-04 DIAGNOSIS — Z79899 Other long term (current) drug therapy: Secondary | ICD-10-CM | POA: Insufficient documentation

## 2019-02-04 DIAGNOSIS — R1110 Vomiting, unspecified: Secondary | ICD-10-CM | POA: Diagnosis present

## 2019-02-04 DIAGNOSIS — F17210 Nicotine dependence, cigarettes, uncomplicated: Secondary | ICD-10-CM | POA: Insufficient documentation

## 2019-02-04 LAB — I-STAT BETA HCG BLOOD, ED (MC, WL, AP ONLY): I-stat hCG, quantitative: <5 m[IU]/mL (ref <5)

## 2019-02-04 LAB — I-STAT CHEM 8, ED
BUN: 11 mg/dL (ref 6–20)
Calcium, Ion: 1.04 mmol/L — ABNORMAL LOW (ref 1.15–1.40)
Chloride: 106 mmol/L (ref 98–111)
Creatinine, Ser: 0.7 mg/dL (ref 0.44–1.00)
Glucose, Bld: 116 mg/dL — ABNORMAL HIGH (ref 70–99)
HCT: 40 % (ref 36.0–46.0)
Hemoglobin: 13.6 g/dL (ref 12.0–15.0)
Potassium: 3.9 mmol/L (ref 3.5–5.1)
Sodium: 141 mmol/L (ref 135–145)
TCO2: 26 mmol/L (ref 22–32)

## 2019-02-04 MED ORDER — HALOPERIDOL LACTATE 5 MG/ML IJ SOLN
2.0000 mg | Freq: Once | INTRAMUSCULAR | Status: AC
  Administered 2019-02-04: 2 mg via INTRAVENOUS
  Filled 2019-02-04: qty 1

## 2019-02-04 NOTE — ED Notes (Signed)
Pt ambulated to the bathroom with no problems. Only complaint at this time is feeling "jittery".

## 2019-02-04 NOTE — ED Triage Notes (Signed)
The pt arrived by [facility] from home the pt has been vomiting for approx 8 hours  No diarrhea  She ate earlier today before the vomiting started.  Vomiting on arrival to the ed  Ems reports dark colored vomitus  Iv per ems  zofran 4mg  given   243ml nss bolus

## 2019-02-04 NOTE — ED Triage Notes (Signed)
The pt arrived by [facility] from home  Vomiting for several hours  Some coffee ground  Alert oriented skin warm and dry  lmp last month

## 2019-02-04 NOTE — ED Provider Notes (Signed)
[facility]PART[facility] Provider Note   CSN: [id number] Arrival date & time: 02/04/19  0009     History   Chief Complaint Chief Complaint  Patient presents with  . Emesis    HPI Adelisa Satterwhite is a 33 y.o. female.     The history is provided by the patient.  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  No f/c/r.  No abdominal pain.  No diarrhea.  No cough no chest pain.    History reviewed. No pertinent past medical history.  There are no active problems to display for this patient.   Past Surgical History:  Procedure Laterality Date  . CESAREAN SECTION    . TONSILLECTOMY       OB History    Gravida  2   Para      Term      Preterm      AB  1   Living  1     SAB      TAB      Ectopic      Multiple      Live Births               Home Medications    Prior to Admission medications   Medication Sig Start Date End Date Taking? Authorizing Provider  fluticasone (FLONASE) 50 MCG/ACT nasal spray Place 2 sprays into both nostrils daily. 06/13/18   Tasia Catchings, Amy V, PA-C  ipratropium (ATROVENT) 0.06 % nasal spray Place 2 sprays into both nostrils 4 (four) times daily. 06/13/18   Tasia Catchings, Amy V, PA-C  predniSONE (DELTASONE) 50 MG tablet Take 1 tablet (50 mg total) by mouth daily. 06/13/18   Ok Edwards, PA-C    Family History Family History  Problem Relation Age of Onset  . Diabetes Mother   . Diabetes Father   . Breast cancer Maternal Grandmother   . Diabetes Maternal  Grandmother   . Clotting disorder Maternal Grandmother   . Diabetes Maternal Grandfather   . Diabetes Paternal Grandmother   . Diabetes Paternal Grandfather     Social History Social History   Tobacco Use  . Smoking status: Current Every Day Smoker    Packs/day: 0.50    Types: Cigarettes  . Smokeless tobacco: Never Used  Substance Use Topics  . Alcohol use: Yes    Comment: socially  . Drug use: Yes    Types: Marijuana    Comment: occasional, last smoked 2 weeks ago from 12/23/16     Allergies   Amoxicillin   Review of Systems Review of Systems  Constitutional: Negative for fever.  HENT: Negative for sore throat.   Respiratory: Negative for cough.   Cardiovascular: Negative for chest pain.  Gastrointestinal: Positive for vomiting. Negative for abdominal pain and diarrhea.  Endocrine: Negative for polyuria.  Genitourinary: Negative for flank pain.  Musculoskeletal: Negative for arthralgias.  Neurological: Negative for speech difficulty.  Hematological: Negative for adenopathy.  Psychiatric/Behavioral: Negative for agitation.  All other systems reviewed and are negative.    Physical Exam Updated Vital Signs  BP (!) 141/101   Pulse 70   Resp (!) 22   Ht 5\' 9"  (1.753 m)   Wt 110.2 kg   LMP 02/04/2019 (Exact Date)   SpO2 100%   BMI 35.88 kg/m   Physical Exam Vitals signs and nursing note reviewed.  Constitutional:      Appearance: She is obese. She is not ill-appearing.  HENT:     Head: Normocephalic and atraumatic.     Nose: Nose normal.  Eyes:     Conjunctiva/sclera: Conjunctivae normal.     Pupils: Pupils are equal, round, and reactive to light.  Neck:     Musculoskeletal: Normal range of motion and neck supple.  Cardiovascular:     Rate and Rhythm: Normal rate and regular rhythm.     Pulses: Normal pulses.     Heart sounds: Normal heart sounds.  Pulmonary:     Effort: Pulmonary effort is normal.     Breath sounds: Normal breath sounds.   Abdominal:     General: Abdomen is flat. Bowel sounds are normal.     Tenderness: There is no abdominal tenderness. There is no guarding or rebound.  Musculoskeletal: Normal range of motion.  Skin:    General: Skin is warm and dry.     Capillary Refill: Capillary refill takes less than 2 seconds.  Neurological:     General: No focal deficit present.     Mental Status: She is alert and oriented to person, place, and time.  Psychiatric:        Mood and Affect: Mood normal.        Behavior: Behavior normal.      ED Treatments / Results  Labs (all labs ordered are listed, but only abnormal results are displayed) Results for orders placed or performed during the hospital encounter of 02/04/19  I-Stat Beta hCG blood, ED ([facility], [facility], [facility] only)  Result Value Ref Range   I-stat hCG, quantitative <5.0 <5 mIU/mL   Comment 3          I-stat chem 8, ED (not at [facility] or [facility])  Result Value Ref Range   Sodium 141 135 - 145 mmol/L   Potassium 3.9 3.5 - 5.1 mmol/L   Chloride 106 98 - 111 mmol/L   BUN 11 6 - 20 mg/dL   Creatinine, Ser 6.040.70 0.44 - 1.00 mg/dL   Glucose, Bld 540116 (H) 70 - 99 mg/dL   Calcium, Ion 9.811.04 (L) 1.15 - 1.40 mmol/L   TCO2 26 22 - 32 mmol/L   Hemoglobin 13.6 12.0 - 15.0 g/dL   HCT 19.140.0 47.836.0 - 29.546.0 %   No results found.  EKG EKG Interpretation  Date/Time:  Monday February 04 2019 00:31:03 EDT Ventricular Rate:  63 PR Interval:    QRS Duration: 83 QT Interval:  414 QTC Calculation: 424 R Axis:   53 Text Interpretation:  Sinus rhythm Atrial premature complex Confirmed by Nicanor AlconPalumbo, Maximos Zayas ([id number]) on 02/04/2019 12:36:27 AM   Radiology No results found.  Procedures Procedures (including critical care time)  Medications Ordered in ED Medications  haloperidol lactate (HALDOL) injection 2 mg (has no administration in time range)     Symptoms are consistent with marijuana induced cyclic vomiting.  I do not see any signs of a surgical abdomen. Symptoms are not consistent  with appendicitis or biliary colic. There are no indications for advanced imaging at this time.  The patient has been counseled on cessation.  Strict return precautions given.  Patient responded well to  medication in the department and has PO challenged successfully.    Final Clinical Impressions(s) / ED Diagnoses   Return for intractable cough, coughing up blood,fevers >100.4 unrelieved by medication, shortness of breath, intractable vomiting, chest pain, shortness of breath, weakness,numbness, changes in speech, facial asymmetry,abdominal pain, passing out,Inability to tolerate liquids or food, cough, altered mental status or any concerns. No signs of systemic illness or infection. The patient is nontoxic-appearing on exam and vital signs are within normal limits.   I have reviewed the triage vital signs and the nursing notes. Pertinent labs &imaging results that were available during my care of the patient were reviewed by me and considered in my medical decision making (see chart for details).  After history, exam, and medical workup I feel the patient has been appropriately medically screened and is safe for discharge home. Pertinent diagnoses were discussed with the patient. Patient was given return precautions      Presleigh Feldstein, MD 02/04/19 [phone]

## 2019-02-06 ENCOUNTER — Ambulatory Visit (HOSPITAL_COMMUNITY)
Admission: EM | Admit: 2019-02-06 | Discharge: 2019-02-06 | Disposition: A | Discharge status: Home / Self Care | Payer: Medicaid Other | Attending: Emergency Medicine | Admitting: Emergency Medicine

## 2019-02-06 ENCOUNTER — Encounter (HOSPITAL_COMMUNITY): Payer: Self-pay | Admitting: Emergency Medicine

## 2019-02-06 ENCOUNTER — Other Ambulatory Visit: Payer: Self-pay

## 2019-02-06 DIAGNOSIS — Z3202 Encounter for pregnancy test, result negative: Secondary | ICD-10-CM

## 2019-02-06 DIAGNOSIS — Z20828 Contact with and (suspected) exposure to other viral communicable diseases: Secondary | ICD-10-CM | POA: Insufficient documentation

## 2019-02-06 DIAGNOSIS — F17210 Nicotine dependence, cigarettes, uncomplicated: Secondary | ICD-10-CM | POA: Diagnosis not present

## 2019-02-06 DIAGNOSIS — Z880 Allergy status to penicillin: Secondary | ICD-10-CM | POA: Insufficient documentation

## 2019-02-06 DIAGNOSIS — Z833 Family history of diabetes mellitus: Secondary | ICD-10-CM | POA: Insufficient documentation

## 2019-02-06 DIAGNOSIS — R112 Nausea with vomiting, unspecified: Secondary | ICD-10-CM | POA: Insufficient documentation

## 2019-02-06 LAB — COMPREHENSIVE METABOLIC PANEL
ALT: 18 U/L (ref 0–44)
AST: 21 U/L (ref 15–41)
Albumin: 4.1 g/dL (ref 3.5–5.0)
Alkaline Phosphatase: 73 U/L (ref 38–126)
Anion gap: 11 (ref 5–15)
BUN: <5 mg/dL — ABNORMAL LOW (ref 6–20)
CO2: 30 mmol/L (ref 22–32)
Calcium: 9.3 mg/dL (ref 8.9–10.3)
Chloride: 93 mmol/L — ABNORMAL LOW (ref 98–111)
Creatinine, Ser: 0.85 mg/dL (ref 0.44–1.00)
GFR calc Af Amer: >60 mL/min (ref >60)
GFR calc non Af Amer: >60 mL/min (ref >60)
Glucose, Bld: 99 mg/dL (ref 70–99)
Potassium: 3 mmol/L — ABNORMAL LOW (ref 3.5–5.1)
Sodium: 134 mmol/L — ABNORMAL LOW (ref 135–145)
Total Bilirubin: 0.8 mg/dL (ref 0.3–1.2)
Total Protein: 7.7 g/dL (ref 6.5–8.1)

## 2019-02-06 LAB — POCT PREGNANCY, URINE: Preg Test, Ur: NEGATIVE

## 2019-02-06 LAB — POCT URINALYSIS DIP (DEVICE)
Glucose, UA: NEGATIVE mg/dL
Hgb urine dipstick: NEGATIVE
Ketones, ur: NEGATIVE mg/dL
Leukocytes,Ua: NEGATIVE
Nitrite: NEGATIVE
Protein, ur: 100 mg/dL — AB
Specific Gravity, Urine: 1.02 (ref 1.005–1.030)
Urobilinogen, UA: 1 mg/dL (ref 0.0–1.0)
pH: 7.5 (ref 5.0–8.0)

## 2019-02-06 LAB — CBC
HCT: 43.8 % (ref 36.0–46.0)
Hemoglobin: 14.3 g/dL (ref 12.0–15.0)
MCH: 28 pg (ref 26.0–34.0)
MCHC: 32.6 g/dL (ref 30.0–36.0)
MCV: 85.9 fL (ref 80.0–100.0)
Platelets: 352 K/uL (ref 150–400)
RBC: 5.1 MIL/uL (ref 3.87–5.11)
RDW: 12.4 % (ref 11.5–15.5)
WBC: 5.9 K/uL (ref 4.0–10.5)
nRBC: 0 % (ref 0.0–0.2)

## 2019-02-06 MED ORDER — ONDANSETRON HCL 4 MG/2ML IJ SOLN: 4.0000 mg | Freq: Once | INTRAMUSCULAR | Status: DC

## 2019-02-06 MED ORDER — LIDOCAINE VISCOUS HCL 2 % MT SOLN
15.0000 mL | Freq: Once | OROMUCOSAL | Status: AC
  Administered 2019-02-06: 15 mL via ORAL

## 2019-02-06 MED ORDER — ONDANSETRON HCL 4 MG/2ML IJ SOLN
INTRAMUSCULAR | Status: AC
  Filled 2019-02-06: qty 2

## 2019-02-06 MED ORDER — SODIUM CHLORIDE 0.9 % IV BOLUS
1000.0000 mL | Freq: Once | INTRAVENOUS | Status: AC
  Administered 2019-02-06: 1000 mL via INTRAVENOUS

## 2019-02-06 MED ORDER — LIDOCAINE VISCOUS HCL 2 % MT SOLN
OROMUCOSAL | Status: AC
  Filled 2019-02-06: qty 15

## 2019-02-06 MED ORDER — HYDROXYZINE HCL 10 MG PO TABS: 10.0000 mg | ORAL_TABLET | Freq: Four times a day (QID) | ORAL | 0 refills | Status: AC

## 2019-02-06 MED ORDER — ALUM & MAG HYDROXIDE-SIMETH 200-200-20 MG/5ML PO SUSP
30.0000 mL | Freq: Once | ORAL | Status: AC
  Administered 2019-02-06: 30 mL via ORAL

## 2019-02-06 MED ORDER — ONDANSETRON HCL 4 MG/2ML IJ SOLN
4.0000 mg | Freq: Once | INTRAMUSCULAR | Status: AC
  Administered 2019-02-06: 4 mg via INTRAVENOUS

## 2019-02-06 MED ORDER — ONDANSETRON HCL 8 MG PO TABS: 8.0000 mg | ORAL_TABLET | Freq: Three times a day (TID) | ORAL | 0 refills | Status: DC | PRN

## 2019-02-06 MED ORDER — ALUM & MAG HYDROXIDE-SIMETH 200-200-20 MG/5ML PO SUSP
ORAL | Status: AC
  Filled 2019-02-06: qty 30

## 2019-02-06 MED ORDER — PANTOPRAZOLE SODIUM 20 MG PO TBEC: 20.0000 mg | DELAYED_RELEASE_TABLET | Freq: Every day | ORAL | 0 refills | Status: DC

## 2019-02-06 NOTE — Discharge Instructions (Addendum)
Take the pantoprazole daily for 14 days.    Take the hydroxyzine every 6 hours for 3 days.  This medication may make you drowsy so do not drive, operate machinery, or drink alcohol with this.    Take the anti-nausea medication Zofran every 8 hours as needed.    Go to the emergency department if you are vomiting and unable to keep down fluids or if you have blood in your vomit.

## 2019-02-06 NOTE — ED Provider Notes (Signed)
[facility]    CSN: [id number] Arrival date & time: 02/06/19  1234     History   Chief Complaint Chief Complaint  Patient presents with  . Vomiting    HPI Donna IdeKeisha Cook is a 33 y.o. female.   Patient presents with chills, nausea, and vomiting x 3-4 days.  She states she has vomited 5 times today; vomit yellow with hematemesis and states she has "coffee ground emesis".  She denies fever, diarrhea, dysuria, abdominal pain, back pain, or other symptoms.  She was seen in the ED for this on 02/04/2019 but states she is continuing to vomit and not feel well.  She returned from travel to visit family in [place] last week.  She denies known COVID contacts.    The history is provided by the patient.    History reviewed. No pertinent past medical history.  There are no active problems to display for this patient.   Past Surgical History:  Procedure Laterality Date  . CESAREAN SECTION    . TONSILLECTOMY      OB History    Gravida  2   Para      Term      Preterm      AB  1   Living  1     SAB      TAB      Ectopic      Multiple      Live Births               Home Medications    Prior to Admission medications   Medication Sig Start Date End Date Taking? Authorizing Provider  fluticasone (FLONASE) 50 MCG/ACT nasal spray Place 2 sprays into both nostrils daily. Patient not taking: Reported on 02/04/2019 06/13/18   Belinda FisherYu, Amy V, PA-C  hydrOXYzine (ATARAX/VISTARIL) 10 MG tablet Take 1 tablet (10 mg total) by mouth every 6 (six) hours for 3 days. 02/06/19 02/09/19  Mickie Bailate, Deannah Rossi H, NP  ipratropium (ATROVENT) 0.06 % nasal spray Place 2 sprays into both nostrils 4 (four) times daily. Patient not taking: Reported on 02/04/2019 06/13/18   Belinda FisherYu, Amy V, PA-C  ondansetron (ZOFRAN) 8 MG tablet Take 1 tablet (8 mg total) by mouth every 8 (eight) hours as needed for nausea or vomiting. 02/06/19   Mickie Bailate, Pandora Mccrackin H, NP  pantoprazole (PROTONIX) 20 MG tablet Take 1 tablet (20 mg total)  by mouth daily. 02/06/19   Mickie Bailate, Binnie Droessler H, NP  predniSONE (DELTASONE) 50 MG tablet Take 1 tablet (50 mg total) by mouth daily. Patient not taking: Reported on 02/04/2019 06/13/18   Lurline IdolYu, Amy V, PA-C    Family History Family History  Problem Relation Age of Onset  . Diabetes Mother   . Diabetes Father   . Breast cancer Maternal Grandmother   . Diabetes Maternal Grandmother   . Clotting disorder Maternal Grandmother   . Diabetes Maternal Grandfather   . Diabetes Paternal Grandmother   . Diabetes Paternal Grandfather     Social History Social History   Tobacco Use  . Smoking status: Current Every Day Smoker    Packs/day: 0.50    Types: Cigarettes  . Smokeless tobacco: Never Used  Substance Use Topics  . Alcohol use: Yes    Comment: socially  . Drug use: Yes    Types: Marijuana    Comment: occasional, last smoked 2 weeks ago from 12/23/16     Allergies   Amoxicillin   Review of Systems Review of Systems  Constitutional:  Negative for chills and fever.  HENT: Negative for ear pain and sore throat.   Eyes: Negative for pain and visual disturbance.  Respiratory: Negative for cough and shortness of breath.   Cardiovascular: Negative for chest pain and palpitations.  Gastrointestinal: Positive for nausea and vomiting. Negative for abdominal pain and diarrhea.  Genitourinary: Negative for dysuria and hematuria.  Musculoskeletal: Negative for arthralgias and back pain.  Skin: Negative for color change and rash.  Neurological: Negative for seizures and syncope.  All other systems reviewed and are negative.    Physical Exam Triage Vital Signs ED Triage Vitals  Enc Vitals Group     BP      Pulse      Resp      Temp      Temp src      SpO2      Weight      Height      Head Circumference      Peak Flow      Pain Score      Pain Loc      Pain Edu?      Excl. in GC?    No data found.  Updated Vital Signs BP 137/79 (BP Location: Right Arm) Comment (BP Location): large  cuff  Pulse 98   Temp 99.4 F (37.4 C) (Oral)   Resp 20   LMP 01/04/2019   SpO2 98%   Visual Acuity Right Eye Distance:   Left Eye Distance:   Bilateral Distance:    Right Eye Near:   Left Eye Near:    Bilateral Near:     Physical Exam Vitals signs and nursing note reviewed.  Constitutional:      General: She is not in acute distress.    Appearance: She is well-developed.  HENT:     Head: Normocephalic and atraumatic.     Right Ear: Tympanic membrane normal.     Left Ear: Tympanic membrane normal.     Mouth/Throat:     Mouth: Mucous membranes are moist.     Pharynx: Oropharynx is clear.  Eyes:     Conjunctiva/sclera: Conjunctivae normal.  Neck:     Musculoskeletal: Neck supple.  Cardiovascular:     Rate and Rhythm: Normal rate and regular rhythm.     Heart sounds: No murmur.  Pulmonary:     Effort: Pulmonary effort is normal. No respiratory distress.     Breath sounds: Normal breath sounds.  Abdominal:     Palpations: Abdomen is soft.     Tenderness: There is no abdominal tenderness. There is no right CVA tenderness, left CVA tenderness, guarding or rebound.  Skin:    General: Skin is warm and dry.     Findings: No rash.  Neurological:     Mental Status: She is alert.      UC Treatments / Results  Labs (all labs ordered are listed, but only abnormal results are displayed) Labs Reviewed  POCT URINALYSIS DIP (DEVICE) - Abnormal; Notable for the following components:      Result Value   Bilirubin Urine SMALL (*)    Protein, ur 100 (*)    All other components within normal limits  NOVEL CORONAVIRUS, NAA (HOSPITAL ORDER, SEND-OUT TO REF LAB)  CBC  COMPREHENSIVE METABOLIC PANEL  POCT PREGNANCY, URINE    EKG   Radiology No results found.  Procedures Procedures (including critical care time)  Medications Ordered in UC Medications  alum & mag hydroxide-simeth (MAALOX/MYLANTA) 200-200-20 MG/5ML suspension 30  mL (30 mLs Oral Given 02/06/19 1439)    And   lidocaine (XYLOCAINE) 2 % viscous mouth solution 15 mL (15 mLs Oral Given 02/06/19 1439)  sodium chloride 0.9 % bolus 1,000 mL (1,000 mLs Intravenous New Bag/Given 02/06/19 1419)  ondansetron (ZOFRAN) injection 4 mg (4 mg Intravenous Given 02/06/19 1420)  ondansetron (ZOFRAN) 4 MG/2ML injection (has no administration in time range)  alum & mag hydroxide-simeth (MAALOX/MYLANTA) 200-200-20 MG/5ML suspension (has no administration in time range)  lidocaine (XYLOCAINE) 2 % viscous mouth solution (has no administration in time range)    Initial Impression / Assessment and Plan / UC Course  I have reviewed the triage vital signs and the nursing notes.  Pertinent labs & imaging results that were available during my care of the patient were reviewed by me and considered in my medical decision making (see chart for details).   Vomiting.  Patient seen, case reviewed, treatment plan reviewed with Dr. Lanny Cramp.  Patient was treated with a liter of IV fluids and IV Zofran here.  Discharging with pantoprazole, Zofran, and hydroxyzine.  Strict instructions given to patient to go to the emergency department if she develops intractable vomiting or hematemesis.     Final Clinical Impressions(s) / UC Diagnoses   Final diagnoses:  Non-intractable vomiting with nausea, unspecified vomiting type     Discharge Instructions     Take the pantoprazole daily for 14 days.    Take the hydroxyzine every 6 hours for 3 days.  This medication may make you drowsy so do not drive, operate machinery, or drink alcohol with this.    Take the anti-nausea medication Zofran every 8 hours as needed.    Go to the emergency department if you are vomiting and unable to keep down fluids or if you have blood in your vomit.        ED Prescriptions    Medication Sig Dispense Auth. Provider   hydrOXYzine (ATARAX/VISTARIL) 10 MG tablet Take 1 tablet (10 mg total) by mouth every 6 (six) hours for 3 days. 12 tablet Sharion Balloon, NP    ondansetron (ZOFRAN) 8 MG tablet Take 1 tablet (8 mg total) by mouth every 8 (eight) hours as needed for nausea or vomiting. 20 tablet Sharion Balloon, NP   pantoprazole (PROTONIX) 20 MG tablet Take 1 tablet (20 mg total) by mouth daily. 14 tablet Sharion Balloon, NP     Controlled Substance Prescriptions [place] Controlled Substance Registry consulted? Not Applicable   Sharion Balloon, NP 02/06/19 1525

## 2019-02-06 NOTE — ED Triage Notes (Signed)
Patient reports vomiting since Sunday.  Patient did go to ED on 02/04/2019.Marland Kitchencontinues to vomit.  Reports 5 episodes of vomiting today.    No diarrhea.  No bm since 02/01/2019 Denies burning with urination.    Patient has heartburn and indigestion.   No fever.

## 2019-02-07 LAB — NOVEL CORONAVIRUS, NAA (HOSP ORDER, SEND-OUT TO REF LAB; TAT 18-24 HRS): SARS-CoV-2, NAA: NOT DETECTED

## 2019-06-05 ENCOUNTER — Other Ambulatory Visit: Payer: Self-pay

## 2019-06-05 DIAGNOSIS — Z20822 Contact with and (suspected) exposure to covid-19: Secondary | ICD-10-CM

## 2019-06-07 LAB — NOVEL CORONAVIRUS, NAA: SARS-CoV-2, NAA: NOT DETECTED

## 2019-11-18 ENCOUNTER — Ambulatory Visit: Payer: Medicaid Other | Attending: Internal Medicine

## 2019-11-18 DIAGNOSIS — Z23 Encounter for immunization: Secondary | ICD-10-CM

## 2019-11-18 NOTE — Progress Notes (Signed)
   Covid-19 Vaccination Clinic  Name:  Kailena Lubas    MRN: [id number] DOB: March 14, 1986  11/18/2019  Ms. Huebert was observed post Covid-19 immunization for 15 minutes without incident. She was provided with Vaccine Information Sheet and instruction to access the V-Safe system.   Ms. Shuffield was instructed to call 911 with any severe reactions post vaccine: Marland Kitchen Difficulty breathing  . Swelling of face and throat  . A fast heartbeat  . A bad rash all over body  . Dizziness and weakness   Immunizations Administered    Name Date Dose VIS Date Route   Pfizer COVID-19 Vaccine 11/18/2019 12:44 PM 0.3 mL 08/28/2018 Intramuscular   Manufacturer: [employer], [employer]   Lot: [id number]   NDC: [address]-6

## 2019-12-09 ENCOUNTER — Ambulatory Visit: Payer: Medicaid Other | Attending: Internal Medicine

## 2019-12-09 DIAGNOSIS — Z23 Encounter for immunization: Secondary | ICD-10-CM

## 2019-12-09 NOTE — Progress Notes (Signed)
   Covid-19 Vaccination Clinic  Name:  Donna Cook    MRN: [id number] DOB: [birth year]  12/09/2019  [name] was observed post Covid-19 immunization for 15 minutes without incident. She was provided with Vaccine Information Sheet and instruction to access the V-Safe system.   [name] was instructed to call 911 with any severe reactions post vaccine: Marland Kitchen Difficulty breathing  . Swelling of face and throat  . A fast heartbeat  . A bad rash all over body  . Dizziness and weakness   Immunizations Administered    Name Date Dose VIS Date Route   Pfizer COVID-19 Vaccine 12/09/2019 12:44 PM 0.3 mL 08/28/2018 Intramuscular   Manufacturer: [employer], [employer]   Lot: [id number]   NDC: [address]-6

## 2020-10-03 ENCOUNTER — Other Ambulatory Visit: Payer: Self-pay

## 2020-10-03 ENCOUNTER — Ambulatory Visit (HOSPITAL_COMMUNITY)
Admission: EM | Admit: 2020-10-03 | Discharge: 2020-10-03 | Disposition: A | Discharge status: Home / Self Care | Payer: Medicaid Other | Attending: Medical Oncology | Admitting: Medical Oncology

## 2020-10-03 ENCOUNTER — Encounter (HOSPITAL_COMMUNITY): Payer: Self-pay

## 2020-10-03 DIAGNOSIS — R519 Headache, unspecified: Secondary | ICD-10-CM | POA: Diagnosis present

## 2020-10-03 LAB — C-REACTIVE PROTEIN: CRP: 0.7 mg/dL (ref <1.0)

## 2020-10-03 LAB — SEDIMENTATION RATE: Sed Rate: 8 mm/hr (ref 0–22)

## 2020-10-03 MED ORDER — PREDNISONE 10 MG (21) PO TBPK: ORAL_TABLET | Freq: Every day | ORAL | 0 refills | Status: DC

## 2020-10-03 NOTE — ED Triage Notes (Signed)
Pt presents with ongoing left side headache and facial numbness X 2 weeks .

## 2020-10-03 NOTE — ED Provider Notes (Signed)
[facility]    CSN: [id number] Arrival date & time: 10/03/20  1006      History   Chief Complaint Chief Complaint  Patient presents with  . Headache  . Facial Numbness    HPI Donna Cook is a 35 y.o. female.   HPI   Headache: Pt reports that for the past 2 weeks she has left sided facial pain and numbness. Symptoms are not constant but do occur daily. Right before symptoms start she gets a tingling/burning/itchy sensation of her temple followed by a sharp pain in her scalp and left side of her face. Also gets a pain that goes to her left ear and jaw. 10/10 sharp pain. She denies fever, neck stiffness, visual changes, temple tenderness, slurred speech, facial droop, extremity weakness. No head injury. She has tried various OTC medications without success. [facility]: Current.   History reviewed. No pertinent past medical history.  There are no problems to display for this patient.   Past Surgical History:  Procedure Laterality Date  . CESAREAN SECTION    . TONSILLECTOMY      OB History    Gravida  2   Para      Term      Preterm      AB  1   Living  1     SAB      IAB      Ectopic      Multiple      Live Births               Home Medications    Prior to Admission medications   Medication Sig Start Date End Date Taking? Authorizing Provider  fluticasone (FLONASE) 50 MCG/ACT nasal spray Place 2 sprays into both nostrils daily. Patient not taking: Reported on 02/04/2019 06/13/18   Belinda Fisher, PA-C  ipratropium (ATROVENT) 0.06 % nasal spray Place 2 sprays into both nostrils 4 (four) times daily. Patient not taking: Reported on 02/04/2019 06/13/18   Belinda Fisher, PA-C  ondansetron (ZOFRAN) 8 MG tablet Take 1 tablet (8 mg total) by mouth every 8 (eight) hours as needed for nausea or vomiting. 02/06/19   Mickie Bail, NP  pantoprazole (PROTONIX) 20 MG tablet Take 1 tablet (20 mg total) by mouth daily. 02/06/19   Mickie Bail, NP  predniSONE (DELTASONE) 50  MG tablet Take 1 tablet (50 mg total) by mouth daily. Patient not taking: Reported on 02/04/2019 06/13/18   Lurline Idol    Family History Family History  Problem Relation Age of Onset  . Diabetes Mother   . Diabetes Father   . Breast cancer Maternal Grandmother   . Diabetes Maternal Grandmother   . Clotting disorder Maternal Grandmother   . Diabetes Maternal Grandfather   . Diabetes Paternal Grandmother   . Diabetes Paternal Grandfather     Social History Social History   Tobacco Use  . Smoking status: Current Every Day Smoker    Packs/day: 0.50    Types: Cigarettes  . Smokeless tobacco: Never Used  Vaping Use  . Vaping Use: Never used  Substance Use Topics  . Alcohol use: Yes    Comment: socially  . Drug use: Yes    Types: Marijuana    Comment: occasional, last smoked 2 weeks ago from 12/23/16     Allergies   Amoxicillin   Review of Systems Review of Systems  As stated above in HPI Physical Exam Triage Vital Signs ED Triage Vitals  Enc Vitals Group     BP 10/03/20 1037 129/79     Pulse Rate 10/03/20 1037 (!) 110     Resp 10/03/20 1037 20     Temp 10/03/20 1037 98.8 F (37.1 C)     Temp Source 10/03/20 1037 Oral     SpO2 10/03/20 1037 96 %     Weight --      Height --      Head Circumference --      Peak Flow --      Pain Score 10/03/20 1036 7     Pain Loc --      Pain Edu? --      Excl. in GC? --    No data found.  Updated Vital Signs BP 129/79 (BP Location: Left Arm)   Pulse (!) 110   Temp 98.8 F (37.1 C) (Oral)   Resp 20   SpO2 96%   Physical Exam Vitals and nursing note reviewed.  Constitutional:      General: She is not in acute distress.    Appearance: She is well-developed. She is obese. She is not ill-appearing, toxic-appearing or diaphoretic.  HENT:     Head: Normocephalic and atraumatic.     Mouth/Throat:     Mouth: Mucous membranes are moist.     Pharynx: Oropharynx is clear.  Eyes:     General: No visual field  deficit.    Extraocular Movements: Extraocular movements intact.     Pupils: Pupils are equal, round, and reactive to light.  Cardiovascular:     Rate and Rhythm: Normal rate and regular rhythm.  Pulmonary:     Breath sounds: Normal breath sounds.  Abdominal:     General: Bowel sounds are normal.     Palpations: Abdomen is soft.  Musculoskeletal:        General: Normal range of motion.     Cervical back: Normal range of motion and neck supple.  Skin:    Comments: No rash of skin or tenderness to palpation of the temple  Neurological:     General: No focal deficit present.     Mental Status: She is alert and oriented to person, place, and time.     Cranial Nerves: Cranial nerves are intact. No cranial nerve deficit or facial asymmetry.     Sensory: Sensation is intact. No sensory deficit.     Motor: Motor function is intact. No weakness.     Coordination: Coordination normal.     Gait: Gait is intact.     Deep Tendon Reflexes: Reflexes normal.     Comments: Intact sensation to sharp and dull of bilateral face  Psychiatric:        Mood and Affect: Mood normal.        Behavior: Behavior normal.      UC Treatments / Results  Labs (all labs ordered are listed, but only abnormal results are displayed) Labs Reviewed - No data to display  EKG   Radiology No results found.  Procedures Procedures (including critical care time)  Medications Ordered in UC Medications - No data to display  Initial Impression / Assessment and Plan / UC Course  I have reviewed the triage vital signs and the nursing notes.  Pertinent labs & imaging results that were available during my care of the patient were reviewed by me and considered in my medical decision making (see chart for details).     New. Differential includes trigeminal neuralgia vs temporal arteritis vs other.  Discussed with patient including red flag signs and symptoms. At this time we are going to draw labs to assess for  temporal arteritis and start her on high dose steroids. Discussed how to use although she has tolerated these well in the past. Recommended follow up next week with PCP   Final Clinical Impressions(s) / UC Diagnoses   Final diagnoses:  None   Discharge Instructions   None    ED Prescriptions    None     PDMP not reviewed this encounter.   Rushie Chestnut, [place] 10/03/20 1129

## 2020-10-28 ENCOUNTER — Emergency Department (HOSPITAL_COMMUNITY)
Admission: EM | Admit: 2020-10-28 | Discharge: 2020-10-29 | Disposition: A | Discharge status: Home / Self Care | Payer: Medicaid Other | Attending: Emergency Medicine | Admitting: Emergency Medicine

## 2020-10-28 ENCOUNTER — Encounter (HOSPITAL_COMMUNITY): Payer: Self-pay | Admitting: Emergency Medicine

## 2020-10-28 ENCOUNTER — Other Ambulatory Visit: Payer: Self-pay

## 2020-10-28 DIAGNOSIS — S76311A Strain of muscle, fascia and tendon of the posterior muscle group at thigh level, right thigh, initial encounter: Secondary | ICD-10-CM | POA: Insufficient documentation

## 2020-10-28 DIAGNOSIS — W19XXXA Unspecified fall, initial encounter: Secondary | ICD-10-CM | POA: Insufficient documentation

## 2020-10-28 DIAGNOSIS — S79921A Unspecified injury of right thigh, initial encounter: Secondary | ICD-10-CM | POA: Diagnosis present

## 2020-10-28 DIAGNOSIS — F17210 Nicotine dependence, cigarettes, uncomplicated: Secondary | ICD-10-CM | POA: Insufficient documentation

## 2020-10-28 NOTE — ED Triage Notes (Signed)
Emergency Medicine Provider Triage Evaluation Note  Donna Cook , a 35 y.o. female  was evaluated in triage.  Pt complains of right hip pain, tripped and fell leaving work yesterday and did the splint.  Review of Systems  Positive: Right hip pain Negative: Back pain, possible pregnancy  Physical Exam  BP 120/69   Pulse (!) 110   Temp 98.9 F (37.2 C) (Oral)   Resp 18   SpO2 97%  Gen:   Awake, no distress   HEENT:  Atraumatic  Resp:  Normal effort  Cardiac:  Normal rate  Abd:   Nondistended, nontender  MSK:   Moves extremities without difficulty, TTP lateral right hip and right low back  Neuro:  Speech clear   Medical Decision Making  Medically screening exam initiated at 9:52 PM.  Appropriate orders placed.  Donna Cook was informed that the remainder of the evaluation will be completed by another provider, this initial triage assessment does not replace that evaluation, and the importance of remaining in the ED until their evaluation is complete.  Clinical Impression     Donna Cook 10/28/20 2154

## 2020-10-28 NOTE — ED Triage Notes (Signed)
Pt reports that she fell yesterday, now has right hip/upper leg pain.

## 2020-10-29 ENCOUNTER — Emergency Department (HOSPITAL_COMMUNITY): Payer: Medicaid Other

## 2020-10-29 LAB — POC URINE PREG, ED: Preg Test, Ur: NEGATIVE

## 2020-10-29 MED ORDER — NAPROXEN 500 MG PO TABS: 500.0000 mg | ORAL_TABLET | Freq: Two times a day (BID) | ORAL | 0 refills | Status: DC

## 2020-10-29 MED ORDER — KETOROLAC TROMETHAMINE 30 MG/ML IJ SOLN
30.0000 mg | Freq: Once | INTRAMUSCULAR | Status: AC
  Administered 2020-10-29: 30 mg via INTRAMUSCULAR
  Filled 2020-10-29: qty 1

## 2020-10-29 NOTE — ED Provider Notes (Signed)
[facility] EMERGENCY DEPARTMENT Provider Note   CSN: [id number] Arrival date & time: 10/28/20  2101     History Chief Complaint  Patient presents with  . Leg Pain    Donna Cook is a 35 y.o. female.  HPI     This is a 35 year old female with no reported past medical history who presents with right leg pain.  Patient reports that she fell yesterday.  She is unsure how she fell but she ended up doing "a split."  She reports she has significant pain over the posterior aspect of her right upper leg.  It is worse with walking and she walks with a limp.  She took some pain medication at home with minimal relief.  She rates her pain at 10 out of 10.  Denies numbness or tingling of the leg.  Denies hitting her head or loss of consciousness.  Denies other injury.  History reviewed. No pertinent past medical history.  There are no problems to display for this patient.   Past Surgical History:  Procedure Laterality Date  . CESAREAN SECTION    . TONSILLECTOMY       OB History    Gravida  2   Para      Term      Preterm      AB  1   Living  1     SAB      IAB      Ectopic      Multiple      Live Births              Family History  Problem Relation Age of Onset  . Diabetes Mother   . Diabetes Father   . Breast cancer Maternal Grandmother   . Diabetes Maternal Grandmother   . Clotting disorder Maternal Grandmother   . Diabetes Maternal Grandfather   . Diabetes Paternal Grandmother   . Diabetes Paternal Grandfather     Social History   Tobacco Use  . Smoking status: Current Every Day Smoker    Packs/day: 0.50    Types: Cigarettes  . Smokeless tobacco: Never Used  Vaping Use  . Vaping Use: Never used  Substance Use Topics  . Alcohol use: Yes    Comment: socially  . Drug use: Yes    Types: Marijuana    Comment: occasional, last smoked 2 weeks ago from 12/23/16    Home Medications Prior to Admission medications   Medication  Sig Start Date End Date Taking? Authorizing Provider  naproxen (NAPROSYN) 500 MG tablet Take 1 tablet (500 mg total) by mouth 2 (two) times daily. 10/29/20  Yes Arynn Armand, Mayer Masker, MD  fluticasone (FLONASE) 50 MCG/ACT nasal spray Place 2 sprays into both nostrils daily. Patient not taking: Reported on 02/04/2019 06/13/18   Belinda Fisher, PA-C  ipratropium (ATROVENT) 0.06 % nasal spray Place 2 sprays into both nostrils 4 (four) times daily. Patient not taking: Reported on 02/04/2019 06/13/18   Belinda Fisher, PA-C  ondansetron (ZOFRAN) 8 MG tablet Take 1 tablet (8 mg total) by mouth every 8 (eight) hours as needed for nausea or vomiting. 02/06/19   Mickie Bail, NP  pantoprazole (PROTONIX) 20 MG tablet Take 1 tablet (20 mg total) by mouth daily. 02/06/19   Mickie Bail, NP  predniSONE (STERAPRED UNI-PAK 21 TAB) 10 MG (21) TBPK tablet Take by mouth daily. Take 6 tabs by mouth daily  for 2 days, then 5 tabs for 2 days,  then 4 tabs for 2 days, then 3 tabs for 2 days, 2 tabs for 2 days, then 1 tab by mouth daily for 2 days 10/03/20   Rushie Chestnut, PA-C    Allergies    Amoxicillin  Review of Systems   Review of Systems  Musculoskeletal:       Leg pain  Neurological: Negative for weakness and numbness.  All other systems reviewed and are negative.   Physical Exam Updated Vital Signs BP 120/69   Pulse (!) 110   Temp 98.9 F (37.2 C) (Oral)   Resp 18   Ht 1.753 m (5\' 9" )   Wt 110.2 kg   SpO2 97%   BMI 35.88 kg/m   Physical Exam Vitals and nursing note reviewed.  Constitutional:      Appearance: She is well-developed. She is obese. She is not ill-appearing.  HENT:     Head: Normocephalic and atraumatic.     Mouth/Throat:     Mouth: Mucous membranes are moist.  Cardiovascular:     Rate and Rhythm: Normal rate and regular rhythm.  Pulmonary:     Effort: Pulmonary effort is normal. No respiratory distress.  Abdominal:     Palpations: Abdomen is soft.     Tenderness: There is no abdominal  tenderness.  Musculoskeletal:     Cervical back: Neck supple.     Comments: Tenderness to palpation along the hamstring posteriorly extending from the insertion glued into the popliteal fossa, slight fullness noted mid hamstring without overlying skin changes, normal range of motion of the hip and the knee, no overlying skin changes  Skin:    General: Skin is warm and dry.  Neurological:     Mental Status: She is alert and oriented to person, place, and time.  Psychiatric:        Mood and Affect: Mood normal.     ED Results / Procedures / Treatments   Labs (all labs ordered are listed, but only abnormal results are displayed) Labs Reviewed  POC URINE PREG, ED    EKG None  Radiology DG Hip Unilat With Pelvis 2-3 Views Right  Result Date: 10/29/2020 CLINICAL DATA:  Pain after a fall yesterday. EXAM: DG HIP (WITH OR WITHOUT PELVIS) 2-3V RIGHT COMPARISON:  None. FINDINGS: Pelvis and right hip appear intact. No evidence of acute fracture or dislocation. No focal bone lesion or bone destruction. SI joints and symphysis pubis are not displaced. Soft tissues are unremarkable. IMPRESSION: Negative. Electronically Signed   By: 10/31/2020 M.D.   On: 10/29/2020 01:18    Procedures Procedures   Medications Ordered in ED Medications  ketorolac (TORADOL) 30 MG/ML injection 30 mg (30 mg Intramuscular Given 10/29/20 0115)    ED Course  I have reviewed the triage vital signs and the nursing notes.  Pertinent labs & imaging results that were available during my care of the patient were reviewed by me and considered in my medical decision making (see chart for details).    MDM Rules/Calculators/A&P                          Patient presents with an injury to the right leg.  Overall nontoxic and vital signs are reassuring.  Pain corresponds with her hamstring muscle and given mechanism of injury, suspect acute strain.  X-rays ordered from triage and reviewed of the right hip showed no  avulsion fractures.  Recommend ice and compression.  Ibuprofen as needed for pain  at home.  After history, exam, and medical workup I feel the patient has been appropriately medically screened and is safe for discharge home. Pertinent diagnoses were discussed with the patient. Patient was given return precautions.  Final Clinical Impression(s) / ED Diagnoses Final diagnoses:  Strain of right hamstring, initial encounter    Rx / DC Orders ED Discharge Orders         Ordered    naproxen (NAPROSYN) 500 MG tablet  2 times daily        10/29/20 0126           Arayna Illescas, Mayer Masker, MD 10/29/20 [phone]

## 2020-10-29 NOTE — ED Notes (Signed)
Pt to radiology.

## 2020-10-29 NOTE — Discharge Instructions (Addendum)
You were seen today for leg pain after an injury.  Your injury pattern is most consistent with a hamstring strain.  Apply ice and keep compressed.  Take ibuprofen as needed for pain.  Gentle stretching may help.

## 2020-10-29 NOTE — ED Notes (Signed)
Right upper leg wrapped with ace wrap and ice pack applied.

## 2021-04-15 ENCOUNTER — Ambulatory Visit (INDEPENDENT_AMBULATORY_CARE_PROVIDER_SITE_OTHER): Payer: Medicaid Other | Admitting: Podiatrist

## 2021-04-15 ENCOUNTER — Ambulatory Visit (INDEPENDENT_AMBULATORY_CARE_PROVIDER_SITE_OTHER): Payer: Medicaid Other

## 2021-04-15 ENCOUNTER — Other Ambulatory Visit: Payer: Self-pay

## 2021-04-15 DIAGNOSIS — M67479 Ganglion, unspecified ankle and foot: Secondary | ICD-10-CM

## 2021-04-15 DIAGNOSIS — L089 Local infection of the skin and subcutaneous tissue, unspecified: Secondary | ICD-10-CM | POA: Diagnosis not present

## 2021-04-15 DIAGNOSIS — M67472 Ganglion, left ankle and foot: Secondary | ICD-10-CM

## 2021-04-15 DIAGNOSIS — S90229A Contusion of unspecified lesser toe(s) with damage to nail, initial encounter: Secondary | ICD-10-CM

## 2021-04-15 DIAGNOSIS — S90222A Contusion of left lesser toe(s) with damage to nail, initial encounter: Secondary | ICD-10-CM

## 2021-04-15 MED ORDER — KETOCONAZOLE 2 % EX CREA: TOPICAL_CREAM | CUTANEOUS | 2 refills | Status: DC

## 2021-04-15 MED ORDER — CLINDAMYCIN HCL 300 MG PO CAPS: 300.0000 mg | ORAL_CAPSULE | Freq: Three times a day (TID) | ORAL | 0 refills | Status: DC

## 2021-04-19 ENCOUNTER — Encounter: Payer: Self-pay | Admitting: Podiatrist

## 2021-04-19 NOTE — Progress Notes (Signed)
  Chief Complaint  Patient presents with   lesion    Lesion at Lt hallux below or under nail x 1 wk - soreness for 3 wks - 10/10 sharp pains =- no injury - no redness - w/ swelling and drainage (clear and bloody) -tx: epsom salt soaking and dry dressing      HPI: Patient is 35 y.o. female who presents today for pain on the left hallux for the last week. She denies recent injury.  Relates she has noticed clear and bloody drainage under the toe since last night.  She has tried epsom salts and applied a dressing to the toe. She relates the pain as 10/10.    There are no problems to display for this patient.   Current Outpatient Medications on File Prior to Visit  Medication Sig Dispense Refill   fluticasone (FLONASE) 50 MCG/ACT nasal spray Place 2 sprays into both nostrils daily. 1 g 0   No current facility-administered medications on file prior to visit.    Allergies  Allergen Reactions   Amoxicillin Hives    Has patient had a PCN reaction causing immediate rash, facial/tongue/throat swelling, SOB or lightheadedness with hypotension: Yes Has patient had a PCN reaction causing severe rash involving mucus membranes or skin necrosis: No Has patient had a PCN reaction that required hospitalization No Has patient had a PCN reaction occurring within the last 10 years: Yes If all of the above answers are "NO", then may proceed with Cephalosporin use.     Review of Systems No fevers, chills, nausea, muscle aches, no difficulty breathing, no calf pain, no chest pain or shortness of breath.   Physical Exam  GENERAL APPEARANCE: Alert, conversant. Appropriately groomed. No acute distress.   VASCULAR: Pedal pulses palpable DP and PT bilateral.  Capillary refill time is immediate to all digits,  Proximal to distal cooling it warm to warm.  Digital perfusion adequate.   NEUROLOGIC: sensation is intact to 5.07 monofilament at 5/5 sites bilateral.  Light touch is intact bilateral, vibratory  sensation intact bilateral  MUSCULOSKELETAL: acceptable muscle strength, tone and stability bilateral.  No gross boney pedal deformities noted.  No pain, crepitus or limitation noted with foot and ankle range of motion bilateral.   DERMATOLOGIC: skin is warm, supple, and dry.  Light peeling of the skin of the plantar foot noted consistent with fungal skin infection.  Left hallux nail is lifted.  There is swelling under the nail and drainage at the distal tip of the toe-  drainage is yellow. No malodor noted.   Pain with direct pressure noted.   Xrays taken-  no sign of lucency or boney changes to the distal tuft of the left hallux noted.  No empysema in the soft tissues. No sign of fracture or dislocation.    Assessment   1. Contusion of nail bed of toe   2. Subungual hematoma of toe of left foot, initial encounter   3. Infection of great toe       Plan  I recommended removal of the toenail to clear out the infection and the patient did not want to pursue this option.  I agreed that we could try her on antibiotics for a week to see if that would resolve the issue.  She will be seen back in a week for recheck and for removal of the nail and drainage of the infection at that time if the symptoms have not improved or resolved.

## 2021-04-22 ENCOUNTER — Encounter: Payer: Self-pay | Admitting: Podiatrist

## 2021-04-22 ENCOUNTER — Ambulatory Visit (INDEPENDENT_AMBULATORY_CARE_PROVIDER_SITE_OTHER): Payer: Medicaid Other | Admitting: Podiatrist

## 2021-04-22 ENCOUNTER — Other Ambulatory Visit: Payer: Self-pay

## 2021-04-22 DIAGNOSIS — S90229A Contusion of unspecified lesser toe(s) with damage to nail, initial encounter: Secondary | ICD-10-CM

## 2021-04-22 DIAGNOSIS — S90222A Contusion of left lesser toe(s) with damage to nail, initial encounter: Secondary | ICD-10-CM | POA: Diagnosis not present

## 2021-04-22 DIAGNOSIS — B351 Tinea unguium: Secondary | ICD-10-CM

## 2021-04-22 DIAGNOSIS — L089 Local infection of the skin and subcutaneous tissue, unspecified: Secondary | ICD-10-CM

## 2021-04-22 NOTE — Patient Instructions (Addendum)

## 2021-04-22 NOTE — Progress Notes (Signed)
Chief Complaint  Patient presents with   nail check    F/U Lt hallux nail check -pt states nail is looser and still drains some - less swelling and less pain - but remains discolored tx: abx      HPI: Patient is 35 y.o. female who presents today for follow-up of left hallux nail.  Patient relates the nail is more loose and it still drains but she is noticed less swelling and less pain since being on the antibiotic.  She also relates the coloration of the nail is yellow and bothersome to her and she would like to know if this can be treated.   Allergies  Allergen Reactions   Amoxicillin Hives    Has patient had a PCN reaction causing immediate rash, facial/tongue/throat swelling, SOB or lightheadedness with hypotension: Yes Has patient had a PCN reaction causing severe rash involving mucus membranes or skin necrosis: No Has patient had a PCN reaction that required hospitalization No Has patient had a PCN reaction occurring within the last 10 years: Yes If all of the above answers are "NO", then may proceed with Cephalosporin use.     Review of systems is negative except as noted in the HPI.  Denies nausea/ vomiting/ fevers/ chills or night sweats.   Denies difficulty breathing, denies calf pain or tenderness  Physical Exam  Patient is awake, alert, and oriented x 3.  In no acute distress.    Vascular status is intact with palpable pedal pulses DP and PT bilateral and capillary refill time less than 3 seconds bilateral.  No edema or erythema noted.   Neurological exam reveals epicritic and protective sensation grossly intact bilateral.   Dermatological left hallux nail continues to be lifted from the underlying nail bed.  There is a scant amount of drainage underneath the nailbed noted as well.  The nail itself has yellowish-brownish discoloration with subungual debris present consistent with onychomycosis.  No redness, no swelling, no streaking, no lymphangitis is noted.     Musculoskeletal exam: Musculature intact with dorsiflexion, plantarflexion, inversion, eversion. Ankle and First MPJ joint range of motion normal.    Assessment: 1. Nail fungus   2. Contusion of nail bed of toe   3. Subungual hematoma of toe of left foot, initial encounter   4. Infection of great toe      Plan: Treatment options and alternatives were discussed with the patient.  Due to the fact that the nail is continuing to get more loose and still has some drainage beneath the nail I recommended a nonpermanent removal of the nail to allow the new nail to grow back.  The patient did agree I prepped the skin with alcohol infiltrated 2% lidocaine plain in a digital block fashion.  The toe was then prepped with Betadine and exsanguinated and the nail was freed from the underlying nail bed with a freer elevator.  The nailbed was then cleansed well with iodine and dried and antibiotic ointment and a dry sterile dressing was applied.  The nail will also be sent off for culture and we will decide if Lamisil is indicated based on the result.  Patient was given instructions for aftercare if she notices any redness swelling or sign of infection she will call otherwise she will be seen back as needed and we will call with the result of the culture.

## 2022-04-04 ENCOUNTER — Encounter (HOSPITAL_COMMUNITY): Payer: Self-pay

## 2022-04-04 ENCOUNTER — Emergency Department (HOSPITAL_COMMUNITY): Payer: Medicaid Other

## 2022-04-04 ENCOUNTER — Emergency Department (HOSPITAL_COMMUNITY)
Admission: EM | Admit: 2022-04-04 | Discharge: 2022-04-04 | Disposition: A | Discharge status: Home / Self Care | Payer: Medicaid Other | Attending: Emergency Medicine | Admitting: Emergency Medicine

## 2022-04-04 DIAGNOSIS — M25552 Pain in left hip: Secondary | ICD-10-CM | POA: Diagnosis not present

## 2022-04-04 DIAGNOSIS — Y92410 Unspecified street and highway as the place of occurrence of the external cause: Secondary | ICD-10-CM | POA: Insufficient documentation

## 2022-04-04 DIAGNOSIS — S4992XA Unspecified injury of left shoulder and upper arm, initial encounter: Secondary | ICD-10-CM | POA: Diagnosis present

## 2022-04-04 DIAGNOSIS — S40212A Abrasion of left shoulder, initial encounter: Secondary | ICD-10-CM | POA: Diagnosis not present

## 2022-04-04 LAB — PREGNANCY, URINE: Preg Test, Ur: NEGATIVE

## 2022-04-04 MED ORDER — METHOCARBAMOL 500 MG PO TABS: 500.0000 mg | ORAL_TABLET | Freq: Two times a day (BID) | ORAL | 0 refills | Status: DC

## 2022-04-04 NOTE — Discharge Instructions (Addendum)
Your work-up in the ER today was reassuring for acute findings.  X-ray imaging of your shoulder and hip did not show any signs of fracture, dislocation, or foreign body presence.  However, if you have very small pieces of glass these would not show up on x-ray. They should work their way out of your skin own their own. I strongly suggest that you shower and rinse your wounds well with soap and water. Please be aware that your soreness will likely increase in the next 24-48 hours. I have given you a prescription for a muscle relaxer Robaxin to help with this.  Please take this as prescribed as needed and do not drive or operate heavy machinery while on this medication.  May also take Tylenol/ibuprofen as needed for soreness and pain and rest, ice, compress, and elevate areas that are hurting.  Follow-up with your primary care doctor as needed for continued evaluation and management  Return if development of any new or worsening symptoms.
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
## 2022-04-05 NOTE — ED Notes (Signed)
Opened chart to print requested worknote

## 2022-04-09 ENCOUNTER — Ambulatory Visit (INDEPENDENT_AMBULATORY_CARE_PROVIDER_SITE_OTHER): Payer: Medicaid Other

## 2022-04-09 ENCOUNTER — Ambulatory Visit (HOSPITAL_COMMUNITY)
Admission: EM | Admit: 2022-04-09 | Discharge: 2022-04-09 | Disposition: A | Discharge status: Home / Self Care | Payer: Medicaid Other | Attending: Urgent Care | Admitting: Urgent Care

## 2022-04-09 ENCOUNTER — Encounter (HOSPITAL_COMMUNITY): Payer: Self-pay

## 2022-04-09 DIAGNOSIS — M5416 Radiculopathy, lumbar region: Secondary | ICD-10-CM | POA: Diagnosis not present

## 2022-04-09 DIAGNOSIS — M545 Low back pain, unspecified: Secondary | ICD-10-CM | POA: Diagnosis not present

## 2022-04-09 MED ORDER — METHYLPREDNISOLONE 4 MG PO TBPK: ORAL_TABLET | ORAL | 0 refills | Status: DC
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
## 2023-08-24 ENCOUNTER — Emergency Department (HOSPITAL_COMMUNITY)
Admission: EM | Admit: 2023-08-24 | Discharge: 2023-08-24 | Disposition: A | Discharge status: Home / Self Care | Payer: Medicaid Other | Attending: Emergency Medicine | Admitting: Emergency Medicine

## 2023-08-24 ENCOUNTER — Encounter (HOSPITAL_COMMUNITY): Payer: Self-pay | Admitting: Emergency Medicine

## 2023-08-24 ENCOUNTER — Emergency Department (HOSPITAL_COMMUNITY): Payer: Medicaid Other

## 2023-08-24 DIAGNOSIS — R6883 Chills (without fever): Secondary | ICD-10-CM | POA: Diagnosis not present

## 2023-08-24 DIAGNOSIS — R109 Unspecified abdominal pain: Secondary | ICD-10-CM | POA: Insufficient documentation

## 2023-08-24 DIAGNOSIS — R112 Nausea with vomiting, unspecified: Secondary | ICD-10-CM | POA: Diagnosis present

## 2023-08-24 LAB — URINALYSIS, ROUTINE W REFLEX MICROSCOPIC
Bilirubin Urine: NEGATIVE
Glucose, UA: NEGATIVE mg/dL
Ketones, ur: NEGATIVE mg/dL
Nitrite: NEGATIVE
Protein, ur: 100 mg/dL — AB
Specific Gravity, Urine: 1.02 (ref 1.005–1.030)
WBC, UA: >50 WBC/hpf (ref 0–5)
pH: 6 (ref 5.0–8.0)

## 2023-08-24 LAB — CBC WITH DIFFERENTIAL/PLATELET
Abs Immature Granulocytes: 0.03 10*3/uL (ref 0.00–0.07)
Basophils Absolute: 0 10*3/uL (ref 0.0–0.1)
Basophils Relative: 0 %
Eosinophils Absolute: 0 10*3/uL (ref 0.0–0.5)
Eosinophils Relative: 0 %
HCT: 46.1 % — ABNORMAL HIGH (ref 36.0–46.0)
Hemoglobin: 15.5 g/dL — ABNORMAL HIGH (ref 12.0–15.0)
Immature Granulocytes: 1 %
Lymphocytes Relative: 26 %
Lymphs Abs: 1.7 10*3/uL (ref 0.7–4.0)
MCH: 29.4 pg (ref 26.0–34.0)
MCHC: 33.6 g/dL (ref 30.0–36.0)
MCV: 87.5 fL (ref 80.0–100.0)
Monocytes Absolute: 0.7 10*3/uL (ref 0.1–1.0)
Monocytes Relative: 10 %
Neutro Abs: 4.2 10*3/uL (ref 1.7–7.7)
Neutrophils Relative %: 63 %
Platelets: 389 10*3/uL (ref 150–400)
RBC: 5.27 MIL/uL — ABNORMAL HIGH (ref 3.87–5.11)
RDW: 13 % (ref 11.5–15.5)
WBC: 6.6 10*3/uL (ref 4.0–10.5)
nRBC: 0 % (ref 0.0–0.2)

## 2023-08-24 LAB — COMPREHENSIVE METABOLIC PANEL
ALT: 17 U/L (ref 0–44)
AST: 22 U/L (ref 15–41)
Albumin: 4.1 g/dL (ref 3.5–5.0)
Alkaline Phosphatase: 68 U/L (ref 38–126)
Anion gap: 17 — ABNORMAL HIGH (ref 5–15)
BUN: 5 mg/dL — ABNORMAL LOW (ref 6–20)
CO2: 27 mmol/L (ref 22–32)
Calcium: 9.7 mg/dL (ref 8.9–10.3)
Chloride: 91 mmol/L — ABNORMAL LOW (ref 98–111)
Creatinine, Ser: 1.04 mg/dL — ABNORMAL HIGH (ref 0.44–1.00)
GFR, Estimated: >60 mL/min (ref >60)
Glucose, Bld: 125 mg/dL — ABNORMAL HIGH (ref 70–99)
Potassium: 2.8 mmol/L — ABNORMAL LOW (ref 3.5–5.1)
Sodium: 135 mmol/L (ref 135–145)
Total Bilirubin: 0.8 mg/dL (ref 0.0–1.2)
Total Protein: 8 g/dL (ref 6.5–8.1)

## 2023-08-24 LAB — HEMOGLOBIN AND HEMATOCRIT, BLOOD
HCT: 42.7 % (ref 36.0–46.0)
Hemoglobin: 14.1 g/dL (ref 12.0–15.0)

## 2023-08-24 LAB — RESP PANEL BY RT-PCR (RSV, FLU A&B, COVID)  RVPGX2
Influenza A by PCR: NEGATIVE
Influenza B by PCR: NEGATIVE
Resp Syncytial Virus by PCR: NEGATIVE
SARS Coronavirus 2 by RT PCR: NEGATIVE

## 2023-08-24 LAB — HCG, SERUM, QUALITATIVE: Preg, Serum: NEGATIVE

## 2023-08-24 LAB — LIPASE, BLOOD: Lipase: 25 U/L (ref 11–51)

## 2023-08-24 MED ORDER — POTASSIUM CHLORIDE CRYS ER 20 MEQ PO TBCR
40.0000 meq | EXTENDED_RELEASE_TABLET | Freq: Once | ORAL | Status: AC
  Administered 2023-08-24: 40 meq via ORAL
  Filled 2023-08-24: qty 2

## 2023-08-24 MED ORDER — DROPERIDOL 2.5 MG/ML IJ SOLN
2.5000 mg | Freq: Once | INTRAMUSCULAR | Status: AC
  Administered 2023-08-24: 2.5 mg via INTRAVENOUS
  Filled 2023-08-24: qty 2

## 2023-08-24 MED ORDER — ONDANSETRON HCL 4 MG PO TABS: 4.0000 mg | ORAL_TABLET | Freq: Three times a day (TID) | ORAL | 0 refills | Status: DC | PRN

## 2023-08-24 MED ORDER — OMEPRAZOLE 20 MG PO CPDR: 20.0000 mg | DELAYED_RELEASE_CAPSULE | Freq: Every day | ORAL | 0 refills | Status: DC

## 2023-08-24 MED ORDER — POTASSIUM CHLORIDE 10 MEQ/100ML IV SOLN
10.0000 meq | INTRAVENOUS | Status: AC
  Administered 2023-08-24 (×2): 10 meq via INTRAVENOUS
  Filled 2023-08-24: qty 100

## 2023-08-24 MED ORDER — PANTOPRAZOLE SODIUM 40 MG IV SOLR
40.0000 mg | Freq: Once | INTRAVENOUS | Status: AC
  Administered 2023-08-24: 40 mg via INTRAVENOUS
  Filled 2023-08-24: qty 10

## 2023-08-24 MED ORDER — ONDANSETRON HCL 4 MG/2ML IJ SOLN: 4.0000 mg | Freq: Once | INTRAMUSCULAR | Status: DC

## 2023-08-24 MED ORDER — LACTATED RINGERS IV BOLUS
1000.0000 mL | Freq: Once | INTRAVENOUS | Status: AC
  Administered 2023-08-24: 1000 mL via INTRAVENOUS

## 2023-08-24 MED ORDER — IOHEXOL 350 MG/ML SOLN
75.0000 mL | Freq: Once | INTRAVENOUS | Status: AC | PRN
  Administered 2023-08-24: 75 mL via INTRAVENOUS

## 2023-08-24 NOTE — Discharge Instructions (Signed)
Today you are seen for nausea and vomiting.  Please pick up your Zofran and omeprazole and take as directed.  Please follow-up with your primary care physician if her symptoms persist for further evaluation and treatment.  Thank you for letting us treat you today. After reviewing your labs and imaging, I feel you are safe to go home. Please follow up with your PCP in the next several days and provide them with your records from this visit. Return to the Emergency Room if pain becomes severe or symptoms worsen.

## 2023-08-24 NOTE — ED Provider Notes (Signed)
[place] [facility] AT [facility] Provider Note   CSN: [id number] Arrival date & time: 08/24/23  4098     History  Chief Complaint  Patient presents with   Emesis    Donna Cook is a 38 y.o. female past medical history significant for cyclic vomiting presents today for 3 days of emesis.  Patient reports there was coffee-ground-like and dark red blood in her emesis this morning.  Patient also endorses abdominal pain and chills.  Patient denies fever, diarrhea, headache, body aches, shortness of breath, or chest pain.  Patient denies NSAID use, blood thinners, or previous abdominal surgery.   Emesis Associated symptoms: abdominal pain and chills        Home Medications Prior to Admission medications   Medication Sig Start Date End Date Taking? Authorizing Provider  omeprazole (PRILOSEC) 20 MG capsule Take 1 capsule (20 mg total) by mouth daily. 08/24/23  Yes Dolphus Jenny, PA-C  ondansetron (ZOFRAN) 4 MG tablet Take 1 tablet (4 mg total) by mouth every 8 (eight) hours as needed for nausea or vomiting. 08/24/23  Yes Dolphus Jenny, PA-C  phentermine (ADIPEX-P) 37.5 MG tablet Take 37.5 mg by mouth every morning. 08/21/23  Yes [provider]      Allergies    Amoxicillin    Review of Systems   Review of Systems  Constitutional:  Positive for chills.  Gastrointestinal:  Positive for abdominal pain and vomiting.    Physical Exam Updated Vital Signs BP 122/80 (BP Location: Right Arm)   Pulse 60   Temp 98.5 F (36.9 C) (Oral)   Resp 15   LMP 07/09/2023   SpO2 100%  Physical Exam Vitals and nursing note reviewed.  Constitutional:      General: She is not in acute distress.    Appearance: She is well-developed. She is obese. She is not toxic-appearing or diaphoretic.  HENT:     Head: Normocephalic and atraumatic.     Right Ear: External ear normal.     Left Ear: External ear normal.     Nose: Nose normal.     Mouth/Throat:     Mouth:  Mucous membranes are moist.  Eyes:     Conjunctiva/sclera: Conjunctivae normal.  Cardiovascular:     Rate and Rhythm: Normal rate and regular rhythm.     Pulses: Normal pulses.     Heart sounds: Normal heart sounds. No murmur heard. Pulmonary:     Effort: Pulmonary effort is normal. No respiratory distress.     Breath sounds: Normal breath sounds.  Abdominal:     General: Bowel sounds are normal.     Palpations: Abdomen is soft.     Tenderness: There is generalized abdominal tenderness and tenderness in the epigastric area. There is no right CVA tenderness, left CVA tenderness or guarding. Negative signs include Murphy's sign, Rovsing's sign and McBurney's sign.  Musculoskeletal:        General: No swelling.     Cervical back: Neck supple.  Skin:    General: Skin is warm and dry.     Capillary Refill: Capillary refill takes less than 2 seconds.  Neurological:     Mental Status: She is alert.  Psychiatric:        Mood and Affect: Mood normal.     ED Results / Procedures / Treatments   Labs (all labs ordered are listed, but only abnormal results are displayed) Labs Reviewed  COMPREHENSIVE METABOLIC PANEL - Abnormal; Notable for the following  components:      Result Value   Potassium 2.8 (*)    Chloride 91 (*)    Glucose, Bld 125 (*)    BUN 5 (*)    Creatinine, Ser 1.04 (*)    Anion gap 17 (*)    All other components within normal limits  CBC WITH DIFFERENTIAL/PLATELET - Abnormal; Notable for the following components:   RBC 5.27 (*)    Hemoglobin 15.5 (*)    HCT 46.1 (*)    All other components within normal limits  URINALYSIS, ROUTINE W REFLEX MICROSCOPIC - Abnormal; Notable for the following components:   APPearance HAZY (*)    Hgb urine dipstick SMALL (*)    Protein, ur 100 (*)    Leukocytes,Ua LARGE (*)    Bacteria, UA RARE (*)    All other components within normal limits  RESP PANEL BY RT-PCR (RSV, FLU A&B, COVID)  RVPGX2  HCG, SERUM, QUALITATIVE  LIPASE, BLOOD   HEMOGLOBIN AND HEMATOCRIT, BLOOD    EKG None  Radiology CT ANGIO GI BLEED Result Date: 08/24/2023 CLINICAL DATA:  38 year old female with abdominal pain, vomiting with bloody emesis. EXAM: CTA ABDOMEN AND PELVIS WITHOUT AND WITH CONTRAST TECHNIQUE: Multidetector CT imaging of the abdomen and pelvis was performed using the standard protocol during bolus administration of intravenous contrast. Multiplanar reconstructed images and MIPs were obtained and reviewed to evaluate the vascular anatomy. RADIATION DOSE REDUCTION: This exam was performed according to the departmental dose-optimization program which includes automated exposure control, adjustment of the mA and/or kV according to patient size and/or use of iterative reconstruction technique. CONTRAST:  75mL OMNIPAQUE IOHEXOL 350 MG/ML SOLN COMPARISON:  Noncontrast CT Abdomen and Pelvis 07/03/2014. FINDINGS: VASCULAR Minimal calcified atherosclerosis at the aortoiliac bifurcation. Normal caliber abdominal aorta. All major arterial branches appear patent and normal. Visible iliofemoral arteries appear patent and normal. Patent portal venous structures on the delayed phase images. Central venous structures in the abdomen and pelvis also appear to be patent. No gastrointestinal lumen contrast extravasation. No gastro duodenal inflammation identified. Review of the MIP images confirms the above findings. NON-VASCULAR Lower chest: Negative. Normal heart size. No pericardial or pleural effusion. Hepatobiliary: Negative liver and gallbladder. Pancreas: Negative. Spleen: Negative. Adrenals/Urinary Tract: Normal adrenal glands. Normal kidneys. Pelvic phleboliths but no evidence of urinary calculus, obstructive uropathy. Generalized bladder wall thickening is nonspecific. Stomach/Bowel: No dilated large or small bowel. Decompressed distal colon. Appendix tracks medial to the midline, containing gas and normal distally on coronal image 82. No large bowel  inflammation. Fairly decompressed stomach and duodenum. No free air, free fluid, mesenteric inflammation. Lymphatic: No lymphadenopathy. Reproductive: Within normal limits. Other: No pelvis free fluid. Musculoskeletal: Negative. IMPRESSION: 1. No gastrointestinal bleeding by CTA. No acute or inflammatory process identified in the Abdomen. Normal appendix. 2. Nonspecific bladder wall thickening, consider UTI or cystitis. Electronically Signed   By: Odessa Fleming M.D.   On: 08/24/2023 09:30    Procedures Procedures    Medications Ordered in ED Medications  pantoprazole (PROTONIX) injection 40 mg (40 mg Intravenous Given 08/24/23 0829)  lactated ringers bolus 1,000 mL (0 mLs Intravenous Stopped 08/24/23 1001)  droperidol (INAPSINE) 2.5 MG/ML injection 2.5 mg (2.5 mg Intravenous Given 08/24/23 0831)  potassium chloride 10 mEq in 100 mL IVPB (0 mEq Intravenous Stopped 08/24/23 1256)  iohexol (OMNIPAQUE) 350 MG/ML injection 75 mL (75 mLs Intravenous Contrast Given 08/24/23 0920)  potassium chloride SA (KLOR-CON M) CR tablet 40 mEq (40 mEq Oral Given 08/24/23 1042)  ED Course/ Medical Decision Making/ A&P                                 Medical Decision Making Amount and/or Complexity of Data Reviewed Labs: ordered. Radiology: ordered.  Risk Prescription drug management.   This patient presents to the ED with chief complaint(s) of emesis with blood with pertinent past medical history of none which further complicates the presenting complaint. The complaint involves an extensive differential diagnosis and also carries with it a high risk of complications and morbidity.    The differential diagnosis includes Mallory-Weiss tear, upper GI bleed, pancreatitis, choledocholithiasis, appendicitis, COVID, flu, RSV  Additional history obtained: Additional history obtained from family Records reviewed Care Everywhere/External Records  ED Course and Reassessment: Protonix and zofran ordered Patient given  droperidol Patient given oral and IV potassium for hypokalemia Patient give 1L LR Patient able to tolerate oral intake prior to discharge  Independent labs interpretation:  The following labs were independently interpreted:  CBC: Elevated hemoglobin at 15.5 CMP: mildly elevated creatinine at 1.04, hypokalemia at 2.8 Serum hCG: Negative Lipase: 25  UA: Small hemoglobin, large leuks, 100 protein, rare bacteria, 11-20 RBCs, 11-20 squamous epithelial greater than 50 WBCs Respiratory panel: Negative H/H: 14.1  Independent visualization of imaging: - I independently visualized the following imaging with scope of interpretation limited to determining acute life threatening conditions related to emergency care: CT abdomen pelvis with contrast, which revealed no gastrointestinal bleeding by CTA.  No acute or inflammatory identified in the abdomen.  Consultation: - Consulted or discussed management/test interpretation w/ external professional: None  Consideration for admission or further workup: Consider for mission further workup however patient's vital signs, physical exam, labs, and imaging were reassuring.  Patient was also able to tolerate oral intake without difficulty prior to discharge.  Patient will be prescribed short course of Zofran for nausea and vomiting and omeprazole for acid reflux.        Final Clinical Impression(s) / ED Diagnoses Final diagnoses:  Nausea and vomiting, unspecified vomiting type    Rx / DC Orders ED Discharge Orders          Ordered    ondansetron (ZOFRAN) 4 MG tablet  Every 8 hours PRN        08/24/23 1313    omeprazole (PRILOSEC) 20 MG capsule  Daily        08/24/23 1313              Dolphus Jenny, PA-C 08/24/23 1314    Anders Simmonds T, DO 08/25/23 [phone]

## 2023-08-24 NOTE — ED Triage Notes (Signed)
Pt reports she has been vomiting for 3 days and there was frank blood in her emesis this morning. Reports she cannot keep anything down and it is "projectile". No hx of GI issues. LMP was beginning of January. When asked if she could be pregnant she replied "I hope not". Reports generalized belly pain and chills.

## 2023-10-03 ENCOUNTER — Encounter (HOSPITAL_COMMUNITY): Payer: Self-pay

## 2023-10-03 ENCOUNTER — Ambulatory Visit (HOSPITAL_COMMUNITY)
Admission: EM | Admit: 2023-10-03 | Discharge: 2023-10-03 | Disposition: A | Discharge status: Home / Self Care | Attending: Emergency Medicine | Admitting: Emergency Medicine

## 2023-10-03 DIAGNOSIS — K2100 Gastro-esophageal reflux disease with esophagitis, without bleeding: Secondary | ICD-10-CM

## 2023-10-03 DIAGNOSIS — K279 Peptic ulcer, site unspecified, unspecified as acute or chronic, without hemorrhage or perforation: Secondary | ICD-10-CM | POA: Diagnosis not present

## 2023-10-03 MED ORDER — LIDOCAINE VISCOUS HCL 2 % MT SOLN
15.0000 mL | Freq: Once | OROMUCOSAL | Status: AC
  Administered 2023-10-03: 15 mL via OROMUCOSAL

## 2023-10-03 MED ORDER — ALUM & MAG HYDROXIDE-SIMETH 200-200-20 MG/5ML PO SUSP
30.0000 mL | Freq: Once | ORAL | Status: AC
  Administered 2023-10-03: 30 mL via ORAL

## 2023-10-03 MED ORDER — PANTOPRAZOLE SODIUM 40 MG PO TBEC: 40.0000 mg | DELAYED_RELEASE_TABLET | Freq: Every day | ORAL | 0 refills | Status: DC

## 2023-10-03 MED ORDER — LIDOCAINE VISCOUS HCL 2 % MT SOLN
OROMUCOSAL | Status: AC
  Filled 2023-10-03: qty 15

## 2023-10-03 MED ORDER — ALUM & MAG HYDROXIDE-SIMETH 200-200-20 MG/5ML PO SUSP
ORAL | Status: AC
  Filled 2023-10-03: qty 30
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
## 2023-10-03 NOTE — ED Provider Notes (Signed)
 [facility]    CSN: [id number] Arrival date & time: 10/03/23  1422      History   Chief Complaint Chief Complaint  Patient presents with   Emesis   Gastroesophageal Reflux    HPI Donna Cook is a 38 y.o. female.  3 day history of epigastric fullness, pain, and regurgitation. Having burning sensation from epigastric up into throat. Worse with laying flat and certain foods like spicy. She did have a few episodes of vomiting. No blood in vomit. Not having fever. Normal urine and BM. Reports recent history of reflux Took zofran a few days ago  History reviewed. No pertinent past medical history.  There are no active problems to display for this patient.   Past Surgical History:  Procedure Laterality Date   CESAREAN SECTION     TONSILLECTOMY      OB History     Gravida  2   Para      Term      Preterm      AB  1   Living  1      SAB      IAB      Ectopic      Multiple      Live Births               Home Medications    Prior to Admission medications   Medication Sig Start Date End Date Taking? Authorizing Provider  pantoprazole (PROTONIX) 40 MG tablet Take 1 tablet (40 mg total) by mouth daily for 14 days. 10/03/23 10/17/23 Yes Cainen Burnham, Lurena Joiner, PA-C  ondansetron (ZOFRAN) 4 MG tablet Take 1 tablet (4 mg total) by mouth every 8 (eight) hours as needed for nausea or vomiting. 08/24/23   Dolphus Jenny, PA-C  phentermine (ADIPEX-P) 37.5 MG tablet Take 37.5 mg by mouth every morning. 08/21/23   [provider]    Family History Family History  Problem Relation Age of Onset   Diabetes Mother    Diabetes Father    Breast cancer Maternal Grandmother    Diabetes Maternal Grandmother    Clotting disorder Maternal Grandmother    Diabetes Maternal Grandfather    Diabetes Paternal Grandmother    Diabetes Paternal Grandfather     Social History Social History   Tobacco Use   Smoking status: Every Day    Current packs/day: 0.25     Types: Cigarettes   Smokeless tobacco: Never  Vaping Use   Vaping status: Never Used  Substance Use Topics   Alcohol use: Yes    Comment: socially   Drug use: Yes    Types: Marijuana    Comment: occasional, last smoked 2 weeks ago from 12/23/16     Allergies   Amoxicillin   Review of Systems Review of Systems Per HPI  Physical Exam Triage Vital Signs ED Triage Vitals  Encounter Vitals Group     BP 10/03/23 1445 119/81     Systolic BP Percentile --      Diastolic BP Percentile --      Pulse Rate 10/03/23 1445 (!) 118     Resp 10/03/23 1445 18     Temp 10/03/23 1445 98.5 F (36.9 C)     Temp Source 10/03/23 1445 Oral     SpO2 10/03/23 1445 94 %     Weight --      Height --      Head Circumference --      Peak Flow --  Pain Score 10/03/23 1444 7     Pain Loc --      Pain Education --      Exclude from Growth Chart --    No data found.  Updated Vital Signs BP 119/81 (BP Location: Right Arm)   Pulse 100   Temp 98.5 F (36.9 C) (Oral)   Resp 18   LMP 09/01/2023   SpO2 97%   Visual Acuity Right Eye Distance:   Left Eye Distance:   Bilateral Distance:    Right Eye Near:   Left Eye Near:    Bilateral Near:     Physical Exam Vitals and nursing note reviewed.  Constitutional:      Appearance: Normal appearance. She is not ill-appearing or diaphoretic.  HENT:     Mouth/Throat:     Mouth: Mucous membranes are moist.     Pharynx: Oropharynx is clear. No posterior oropharyngeal erythema.  Eyes:     Conjunctiva/sclera: Conjunctivae normal.  Cardiovascular:     Rate and Rhythm: Normal rate and regular rhythm.     Pulses: Normal pulses.     Heart sounds: Normal heart sounds.  Pulmonary:     Effort: Pulmonary effort is normal.     Breath sounds: Normal breath sounds.  Abdominal:     Palpations: Abdomen is soft.     Tenderness: There is abdominal tenderness. There is no right CVA tenderness, left CVA tenderness, guarding or rebound.     Comments:  Discomfort epigastric without guarding/rebound  Musculoskeletal:        General: Normal range of motion.     Cervical back: Normal range of motion.  Skin:    General: Skin is warm and dry.  Neurological:     Mental Status: She is alert and oriented to person, place, and time.      UC Treatments / Results  Labs (all labs ordered are listed, but only abnormal results are displayed) Labs Reviewed - No data to display  EKG   Radiology No results found.  Procedures Procedures (including critical care time)  Medications Ordered in UC Medications  alum & mag hydroxide-simeth (MAALOX/MYLANTA) 200-200-20 MG/5ML suspension 30 mL (30 mLs Oral Given 10/03/23 1528)  lidocaine (XYLOCAINE) 2 % viscous mouth solution 15 mL (15 mLs Mouth/Throat Given 10/03/23 1528)    Initial Impression / Assessment and Plan / UC Course  I have reviewed the triage vital signs and the nursing notes.  Pertinent labs & imaging results that were available during my care of the patient were reviewed by me and considered in my medical decision making (see chart for details).  Afebrile, overall well appearing, good exam No red flags Reflux with possible ulcer etiology GI cocktail is given in clinic. Protonix daily x 14 days, pepcid BID prn, bland diet. She has follow up with PCP in about a week. Advised may need to see GI; PCP can refer. Strict return and ED precautions. Work note provided. Patient agrees to plan  Final Clinical Impressions(s) / UC Diagnoses   Final diagnoses:  Gastroesophageal reflux disease with esophagitis without hemorrhage  Peptic ulcer     Discharge Instructions      Protonix (pantoprazole) once every morning for the next 14 days. It can take 1-3 days to start working. Take first thing in the morning on an empty stomach.  Remain upright for 30 minutes after taking medicine.  Please take the full 14-day course.  You can use famotidine/Pepcid at home as needed.  Bland diet for the  next 2 weeks.  Avoid fatty, greasy, citrusy, spicy, caffeine and alcohol as these can worsen symptoms.  Do not use any ibuprofen/Advil or other NSAIDs as these can worsen symptoms.  Please follow up with your primary care at your next visit! They may want you to see a stomach specialist.   If at any point your symptoms worsen, including severe pain or inability to tolerate fluids, please go directly to the emergency department.    ED Prescriptions     Medication Sig Dispense Auth. Provider   pantoprazole (PROTONIX) 40 MG tablet Take 1 tablet (40 mg total) by mouth daily for 14 days. 14 tablet Tedric Leeth, Lurena Joiner, PA-C      PDMP not reviewed this encounter.   Marlow Baars, [place] 10/03/23 1550

## 2023-10-03 NOTE — Discharge Instructions (Signed)
 Protonix (pantoprazole) once every morning for the next 14 days. It can take 1-3 days to start working. Take first thing in the morning on an empty stomach.  Remain upright for 30 minutes after taking medicine.  Please take the full 14-day course.  You can use famotidine/Pepcid at home as needed.  Bland diet for the next 2 weeks.  Avoid fatty, greasy, citrusy, spicy, caffeine and alcohol as these can worsen symptoms.  Do not use any ibuprofen/Advil or other NSAIDs as these can worsen symptoms.  Please follow up with your primary care at your next visit! They may want you to see a stomach specialist.   If at any point your symptoms worsen, including severe pain or inability to tolerate fluids, please go directly to the emergency department.

## 2023-11-22 ENCOUNTER — Encounter (HOSPITAL_COMMUNITY): Payer: Self-pay

## 2023-11-22 ENCOUNTER — Ambulatory Visit (HOSPITAL_COMMUNITY)
Admission: EM | Admit: 2023-11-22 | Discharge: 2023-11-22 | Disposition: A | Discharge status: Home / Self Care | Attending: Physician Assistant | Admitting: Physician Assistant

## 2023-11-22 ENCOUNTER — Ambulatory Visit (HOSPITAL_COMMUNITY): Payer: Self-pay | Admitting: Physician Assistant

## 2023-11-22 ENCOUNTER — Encounter (HOSPITAL_COMMUNITY): Payer: Self-pay | Admitting: Emergency Medicine

## 2023-11-22 ENCOUNTER — Other Ambulatory Visit: Payer: Self-pay

## 2023-11-22 ENCOUNTER — Emergency Department (HOSPITAL_COMMUNITY)
Admission: EM | Admit: 2023-11-22 | Discharge: 2023-11-23 | Disposition: A | Discharge status: Home / Self Care | Attending: Emergency Medicine | Admitting: Emergency Medicine

## 2023-11-22 DIAGNOSIS — E876 Hypokalemia: Secondary | ICD-10-CM | POA: Insufficient documentation

## 2023-11-22 DIAGNOSIS — K2100 Gastro-esophageal reflux disease with esophagitis, without bleeding: Secondary | ICD-10-CM | POA: Diagnosis present

## 2023-11-22 DIAGNOSIS — Z3202 Encounter for pregnancy test, result negative: Secondary | ICD-10-CM

## 2023-11-22 DIAGNOSIS — R1010 Upper abdominal pain, unspecified: Secondary | ICD-10-CM | POA: Diagnosis not present

## 2023-11-22 DIAGNOSIS — Z87891 Personal history of nicotine dependence: Secondary | ICD-10-CM | POA: Insufficient documentation

## 2023-11-22 DIAGNOSIS — E871 Hypo-osmolality and hyponatremia: Secondary | ICD-10-CM | POA: Insufficient documentation

## 2023-11-22 DIAGNOSIS — R112 Nausea with vomiting, unspecified: Secondary | ICD-10-CM | POA: Diagnosis not present

## 2023-11-22 DIAGNOSIS — R1013 Epigastric pain: Secondary | ICD-10-CM | POA: Diagnosis present

## 2023-11-22 LAB — POCT URINALYSIS DIP (MANUAL ENTRY)
Glucose, UA: NEGATIVE mg/dL
Nitrite, UA: NEGATIVE
Protein Ur, POC: >=300 mg/dL — AB
Spec Grav, UA: 1.025
Urobilinogen, UA: 1 U/dL
pH, UA: 6

## 2023-11-22 LAB — URINALYSIS, ROUTINE W REFLEX MICROSCOPIC
Bilirubin Urine: NEGATIVE
Glucose, UA: NEGATIVE mg/dL
Hgb urine dipstick: NEGATIVE
Ketones, ur: NEGATIVE mg/dL
Nitrite: NEGATIVE
Protein, ur: 100 mg/dL — AB
Specific Gravity, Urine: 1.019 (ref 1.005–1.030)
WBC, UA: >50 WBC/hpf (ref 0–5)
pH: 7 (ref 5.0–8.0)

## 2023-11-22 LAB — CBC WITH DIFFERENTIAL/PLATELET
Abs Immature Granulocytes: 0.05 10*3/uL (ref 0.00–0.07)
Abs Immature Granulocytes: 0.02 10*3/uL (ref 0.00–0.07)
Basophils Absolute: 0 10*3/uL (ref 0.0–0.1)
Basophils Absolute: 0 10*3/uL (ref 0.0–0.1)
Basophils Relative: 0 %
Basophils Relative: 0 %
Eosinophils Absolute: 0 10*3/uL (ref 0.0–0.5)
Eosinophils Absolute: 0 10*3/uL (ref 0.0–0.5)
Eosinophils Relative: 0 %
Eosinophils Relative: 0 %
HCT: 43.1 % (ref 36.0–46.0)
HCT: 43.2 % (ref 36.0–46.0)
Hemoglobin: 14.5 g/dL (ref 12.0–15.0)
Hemoglobin: 14.4 g/dL (ref 12.0–15.0)
Immature Granulocytes: 1 %
Immature Granulocytes: 0 %
Lymphocytes Relative: 21 %
Lymphocytes Relative: 20 %
Lymphs Abs: 1.8 10*3/uL (ref 0.7–4.0)
Lymphs Abs: 1.4 10*3/uL (ref 0.7–4.0)
MCH: 29.1 pg (ref 26.0–34.0)
MCH: 28.8 pg (ref 26.0–34.0)
MCHC: 33.6 g/dL (ref 30.0–36.0)
MCHC: 33.3 g/dL (ref 30.0–36.0)
MCV: 86.5 fL (ref 80.0–100.0)
MCV: 86.4 fL (ref 80.0–100.0)
Monocytes Absolute: 1 10*3/uL (ref 0.1–1.0)
Monocytes Absolute: 0.8 10*3/uL (ref 0.1–1.0)
Monocytes Relative: 12 %
Monocytes Relative: 11 %
Neutro Abs: 5.7 10*3/uL (ref 1.7–7.7)
Neutro Abs: 5.1 10*3/uL (ref 1.7–7.7)
Neutrophils Relative %: 66 %
Neutrophils Relative %: 69 %
Platelets: 352 10*3/uL (ref 150–400)
Platelets: 341 10*3/uL (ref 150–400)
RBC: 4.98 MIL/uL (ref 3.87–5.11)
RBC: 5 MIL/uL (ref 3.87–5.11)
RDW: 12.7 % (ref 11.5–15.5)
RDW: 12.5 % (ref 11.5–15.5)
WBC: 8.5 10*3/uL (ref 4.0–10.5)
WBC: 7.3 10*3/uL (ref 4.0–10.5)
nRBC: 0 % (ref 0.0–0.2)
nRBC: 0 % (ref 0.0–0.2)

## 2023-11-22 LAB — COMPREHENSIVE METABOLIC PANEL WITH GFR
ALT: 14 U/L (ref 0–44)
ALT: 18 U/L (ref 0–44)
AST: 22 U/L (ref 15–41)
AST: 23 U/L (ref 15–41)
Albumin: 4.3 g/dL (ref 3.5–5.0)
Albumin: 4 g/dL (ref 3.5–5.0)
Alkaline Phosphatase: 70 U/L (ref 38–126)
Alkaline Phosphatase: 62 U/L (ref 38–126)
Anion gap: 20 — ABNORMAL HIGH (ref 5–15)
Anion gap: 12 (ref 5–15)
BUN: 6 mg/dL (ref 6–20)
BUN: 7 mg/dL (ref 6–20)
CO2: 22 mmol/L (ref 22–32)
CO2: 24 mmol/L (ref 22–32)
Calcium: 9.7 mg/dL (ref 8.9–10.3)
Calcium: 9.2 mg/dL (ref 8.9–10.3)
Chloride: 90 mmol/L — ABNORMAL LOW (ref 98–111)
Chloride: 92 mmol/L — ABNORMAL LOW (ref 98–111)
Creatinine, Ser: 0.8 mg/dL (ref 0.44–1.00)
Creatinine, Ser: 0.79 mg/dL (ref 0.44–1.00)
GFR, Estimated: >60 mL/min (ref >60)
GFR, Estimated: >60 mL/min (ref >60)
Glucose, Bld: 95 mg/dL (ref 70–99)
Glucose, Bld: 135 mg/dL — ABNORMAL HIGH (ref 70–99)
Potassium: 2.9 mmol/L — ABNORMAL LOW (ref 3.5–5.1)
Potassium: 3.2 mmol/L — ABNORMAL LOW (ref 3.5–5.1)
Sodium: 132 mmol/L — ABNORMAL LOW (ref 135–145)
Sodium: 128 mmol/L — ABNORMAL LOW (ref 135–145)
Total Bilirubin: 0.8 mg/dL (ref 0.0–1.2)
Total Bilirubin: 0.7 mg/dL (ref 0.0–1.2)
Total Protein: 8.1 g/dL (ref 6.5–8.1)
Total Protein: 7.5 g/dL (ref 6.5–8.1)

## 2023-11-22 LAB — MAGNESIUM: Magnesium: 1.9 mg/dL (ref 1.7–2.4)

## 2023-11-22 LAB — LIPASE, BLOOD: Lipase: 58 U/L — ABNORMAL HIGH (ref 11–51)

## 2023-11-22 LAB — POCT URINE PREGNANCY: Preg Test, Ur: NEGATIVE

## 2023-11-22 MED ORDER — ONDANSETRON 4 MG PO TBDP
ORAL_TABLET | ORAL | Status: AC
  Filled 2023-11-22: qty 1

## 2023-11-22 MED ORDER — PANTOPRAZOLE SODIUM 40 MG PO TBEC: 40.0000 mg | DELAYED_RELEASE_TABLET | Freq: Every day | ORAL | 0 refills | Status: DC

## 2023-11-22 MED ORDER — ALUM & MAG HYDROXIDE-SIMETH 200-200-20 MG/5ML PO SUSP
ORAL | Status: AC
  Filled 2023-11-22: qty 30

## 2023-11-22 MED ORDER — LIDOCAINE VISCOUS HCL 2 % MT SOLN
OROMUCOSAL | Status: AC
  Filled 2023-11-22: qty 15

## 2023-11-22 MED ORDER — FAMOTIDINE 20 MG PO TABS: 20.0000 mg | ORAL_TABLET | Freq: Two times a day (BID) | ORAL | 0 refills | Status: AC

## 2023-11-22 MED ORDER — PANTOPRAZOLE SODIUM 40 MG IV SOLR
40.0000 mg | Freq: Once | INTRAVENOUS | Status: AC
  Administered 2023-11-23: 40 mg via INTRAVENOUS
  Filled 2023-11-22: qty 10

## 2023-11-22 MED ORDER — LIDOCAINE VISCOUS HCL 2 % MT SOLN
15.0000 mL | Freq: Once | OROMUCOSAL | Status: AC
  Administered 2023-11-22: 15 mL via OROMUCOSAL

## 2023-11-22 MED ORDER — SUCRALFATE 1 G PO TABS: 1.0000 g | ORAL_TABLET | Freq: Three times a day (TID) | ORAL | 0 refills | Status: DC

## 2023-11-22 MED ORDER — FAMOTIDINE IN NACL 20-0.9 MG/50ML-% IV SOLN
20.0000 mg | Freq: Once | INTRAVENOUS | Status: AC
  Administered 2023-11-23: 20 mg via INTRAVENOUS
  Filled 2023-11-22: qty 50

## 2023-11-22 MED ORDER — PROMETHAZINE HCL 12.5 MG PO TABS: 12.5000 mg | ORAL_TABLET | Freq: Three times a day (TID) | ORAL | 0 refills | Status: DC | PRN

## 2023-11-22 MED ORDER — ONDANSETRON 4 MG PO TBDP
4.0000 mg | ORAL_TABLET | Freq: Once | ORAL | Status: AC
  Administered 2023-11-22: 4 mg via ORAL

## 2023-11-22 MED ORDER — ALUM & MAG HYDROXIDE-SIMETH 200-200-20 MG/5ML PO SUSP
30.0000 mL | Freq: Once | ORAL | Status: AC
  Administered 2023-11-22: 30 mL via ORAL

## 2023-11-22 NOTE — ED Provider Triage Note (Signed)
 Emergency Medicine Provider Triage Evaluation Note  Donna Cook , a 38 y.o. female  was evaluated in triage.  Pt complains of abnormal labs, nausea, vomit, abdominal pain for the past 3 days.  Evaluated at urgent care this morning, sent in due to increased lipase level of 58.  She does have hypokalemia with a potassium of 2.9, EKG was ordered in triage.  Review of Systems  Positive: Nausea, vomiting, abdominal pain Negative: Fever, urinary symptoms  Physical Exam  BP (!) 154/110 (BP Location: Right Arm)   Pulse (!) 102   Temp 98.7 F (37.1 C)   Resp 18   Ht 5\' 9"  (1.753 m)   Wt 122.5 kg   LMP 10/30/2023 (Approximate)   SpO2 100%   BMI 39.87 kg/m  Gen:   Awake, no distress   Resp:  Normal effort  MSK:   Moves extremities without difficulty  Other:    Medical Decision Making  Medically screening exam initiated at 8:31 PM.  Appropriate orders placed.  Donna Cook was informed that the remainder of the evaluation will be completed by another provider, this initial triage assessment does not replace that evaluation, and the importance of remaining in the ED until their evaluation is complete.     Donna Kirkwood, PA-C 11/22/23 2033

## 2023-11-22 NOTE — ED Provider Notes (Addendum)
 [facility]    CSN: [id number] Arrival date & time: 11/22/23  0801      History   Chief Complaint Chief Complaint  Patient presents with   Abdominal Pain   Emesis   Dizziness   Chills    HPI Donna Cook is a 38 y.o. female.   Patient presents today for several day history of abdominal pain, nausea, vomiting, esophageal burning.  She reports symptoms began after she ate at a [employer].  She does have a history of GERD and has seen GI specialist.  She had an upper endoscopy in 2018 but not more recently.  Reports that she has been tested for H. pylori and this was negative.  She is currently prescribed pantoprazole  and has been taking this without improvement of symptoms.  She has also tried Zofran  as well as over-the-counter Tums without improvement of symptoms.  She denies any recent medication changes.  She is able to tolerate fluids but has not been able to eat much of anything as it causes recurrent nausea and vomiting.  Symptoms are worse when she lays flat at night.  She reports that pain is rated 8 on a 0-10 pain scale, described as a burning sensation in her esophagus, no alleviating factors identified.  She denies any fever, melena, hematochezia, hematemesis, unintentional weight loss.    History reviewed. No pertinent past medical history.  There are no active problems to display for this patient.   Past Surgical History:  Procedure Laterality Date   CESAREAN SECTION     TONSILLECTOMY      OB History     Gravida  2   Para      Term      Preterm      AB  1   Living  1      SAB      IAB      Ectopic      Multiple      Live Births               Home Medications    Prior to Admission medications   Medication Sig Start Date End Date Taking? Authorizing Provider  famotidine  (PEPCID ) 20 MG tablet Take 1 tablet (20 mg total) by mouth 2 (two) times daily. 11/22/23  Yes Marlynn Hinckley K, PA-C  promethazine  (PHENERGAN ) 12.5 MG  tablet Take 1 tablet (12.5 mg total) by mouth every 8 (eight) hours as needed for nausea or vomiting. 11/22/23  Yes Nesiah Jump K, PA-C  sucralfate (CARAFATE) 1 g tablet Take 1 tablet (1 g total) by mouth 4 (four) times daily -  with meals and at bedtime. 11/22/23  Yes Makenlee Mckeag K, PA-C  ondansetron  (ZOFRAN ) 4 MG tablet Take 1 tablet (4 mg total) by mouth every 8 (eight) hours as needed for nausea or vomiting. 08/24/23   Carie Charity, PA-C  pantoprazole  (PROTONIX ) 40 MG tablet Take 1 tablet (40 mg total) by mouth daily. 11/22/23 12/22/23  Loring Liskey K, PA-C  phentermine (ADIPEX-P) 37.5 MG tablet Take 37.5 mg by mouth every morning. 08/21/23   [provider]    Family History Family History  Problem Relation Age of Onset   Diabetes Mother    Diabetes Father    Breast cancer Maternal Grandmother    Diabetes Maternal Grandmother    Clotting disorder Maternal Grandmother    Diabetes Maternal Grandfather    Diabetes Paternal Grandmother    Diabetes Paternal Grandfather  Social History Social History   Tobacco Use   Smoking status: Former    Current packs/day: 0.25    Types: Cigarettes   Smokeless tobacco: Never  Vaping Use   Vaping status: Never Used  Substance Use Topics   Alcohol use: Not Currently    Comment: socially   Drug use: Not Currently    Types: Marijuana    Comment: occasional, last smoked 2 weeks ago from 12/23/16     Allergies   Amoxicillin   Review of Systems Review of Systems  Constitutional:  Positive for activity change. Negative for appetite change, fatigue and fever.  Respiratory:  Negative for cough and shortness of breath.   Cardiovascular:  Negative for chest pain.  Gastrointestinal:  Positive for abdominal pain, nausea and vomiting. Negative for constipation and diarrhea.  Genitourinary:  Negative for dysuria, frequency and urgency.     Physical Exam Triage Vital Signs ED Triage Vitals  Encounter Vitals Group     BP 11/22/23  0818 (!) 142/100     Systolic BP Percentile --      Diastolic BP Percentile --      Pulse Rate 11/22/23 0818 98     Resp 11/22/23 0818 16     Temp 11/22/23 0818 98.3 F (36.8 C)     Temp Source 11/22/23 0818 Oral     SpO2 11/22/23 0818 97 %     Weight --      Height --      Head Circumference --      Peak Flow --      Pain Score 11/22/23 0817 8     Pain Loc --      Pain Education --      Exclude from Growth Chart --    No data found.  Updated Vital Signs BP (!) 142/100 (BP Location: Left Arm)   Pulse 98   Temp 98.3 F (36.8 C) (Oral)   Resp 16   LMP 10/30/2023 (Approximate)   SpO2 97%   Visual Acuity Right Eye Distance:   Left Eye Distance:   Bilateral Distance:    Right Eye Near:   Left Eye Near:    Bilateral Near:     Physical Exam Vitals reviewed.  Constitutional:      General: She is awake. She is not in acute distress.    Appearance: Normal appearance. She is well-developed. She is ill-appearing.     Comments: Very pleasant female appears stated age wrapped in a blanket holding an emesis bag but in no acute distress  HENT:     Head: Normocephalic and atraumatic.  Cardiovascular:     Rate and Rhythm: Normal rate and regular rhythm.     Heart sounds: Normal heart sounds, S1 normal and S2 normal. No murmur heard. Pulmonary:     Effort: Pulmonary effort is normal.     Breath sounds: Normal breath sounds. No wheezing, rhonchi or rales.     Comments: Clear to auscultation bilaterally Abdominal:     General: Bowel sounds are normal.     Palpations: Abdomen is soft.     Tenderness: There is abdominal tenderness in the epigastric area and left upper quadrant. There is no right CVA tenderness, left CVA tenderness, guarding or rebound.     Comments: Mild tenderness palpation in upper abdomen with no evidence of acute abdomen on physical exam.  Psychiatric:        Behavior: Behavior is cooperative.      UC Treatments / Results  Labs (all labs ordered are  listed, but only abnormal results are displayed) Labs Reviewed  POCT URINALYSIS DIP (MANUAL ENTRY) - Abnormal; Notable for the following components:      Result Value   Color, UA red (*)    Bilirubin, UA small (*)    Ketones, POC UA trace (5) (*)    Blood, UA moderate (*)    Protein Ur, POC >=300 (*)    Leukocytes, UA Large (3+) (*)    All other components within normal limits  URINE CULTURE  CBC WITH DIFFERENTIAL/PLATELET  COMPREHENSIVE METABOLIC PANEL WITH GFR  LIPASE, BLOOD  POCT URINE PREGNANCY    EKG   Radiology No results found.  Procedures Procedures (including critical care time)  Medications Ordered in UC Medications  ondansetron  (ZOFRAN -ODT) disintegrating tablet 4 mg (4 mg Oral Given 11/22/23 0846)  alum & mag hydroxide-simeth (MAALOX/MYLANTA) 200-200-20 MG/5ML suspension 30 mL (30 mLs Oral Given 11/22/23 0844)  lidocaine  (XYLOCAINE ) 2 % viscous mouth solution 15 mL (15 mLs Mouth/Throat Given 11/22/23 0847)    Initial Impression / Assessment and Plan / UC Course  I have reviewed the triage vital signs and the nursing notes.  Pertinent labs & imaging results that were available during my care of the patient were reviewed by me and considered in my medical decision making (see chart for details).     Patient is well-appearing, afebrile, nontoxic, nontachycardic.  Vital signs are physical exam are reassuring with no indication for emergent evaluation or imaging.  Suspect esophagitis related to uncontrolled GERD as etiology of symptoms given clinical presentation.  Urine pregnancy was negative.  UA did show evidence of UTI but she denied any symptoms so we will send this for culture but defer antibiotics until culture results are available.  CBC, CMP, lipase obtained and pending.  We will contact her if these are abnormal and change our treatment plan.  She was given a GI cocktail as well as Zofran  with improvement in symptoms in clinic today.  Will restart PPI as well as  famotidine .  She was given Carafate for the next week.  She reports that her nausea is uncontrolled with Zofran  send she was given promethazine  and that this will also provide some improvement of sleep disturbance related to her GI symptoms.  We discussed that this medication is sedating and she should not drive or drink alcohol with taking it.  She is to avoid alcohol and NSAIDs.  Recommended a bland diet.  She is established with a GI specialist and was strongly encouraged to follow-up with them as soon as possible.  We discussed that if anything worsens or changes and she has increasing abdominal pain, nausea/vomiting despite antiemetics, melena, hematochezia, weakness she needs to go to the emergency room.  Strict return precautions given.  Work excuse note provided.  Final Clinical Impressions(s) / UC Diagnoses   Final diagnoses:  Gastroesophageal reflux disease with esophagitis without hemorrhage  Nausea and vomiting, unspecified vomiting type  Upper abdominal pain     Discharge Instructions      I would like you to follow-up with a GI specialist soon as possible.  Call them to schedule an appointment.  We are going to start a lot of medication to help coat your stomach and deal with the acid reflux.  Take famotidine  twice daily, pantoprazole  daily on an empty stomach (30 minutes before meal or 2 hours after), Carafate before each meal and before bed.  Eat a bland diet avoid spicy/acidic/fatty foods.  Use promethazine  for nausea and vomiting.  This will make you sleepy so do not drive or drink alcohol with taking it.  Avoid alcohol as well as NSAIDs (aspirin, ibuprofen /Advil , naproxen /Aleve ).  If you have any worsening symptoms you need to go to the emergency room immediately.   ED Prescriptions     Medication Sig Dispense Auth. Provider   sucralfate (CARAFATE) 1 g tablet Take 1 tablet (1 g total) by mouth 4 (four) times daily -  with meals and at bedtime. 28 tablet Anne Sebring K, PA-C    pantoprazole  (PROTONIX ) 40 MG tablet Take 1 tablet (40 mg total) by mouth daily. 30 tablet Ayisha Pol K, PA-C   famotidine  (PEPCID ) 20 MG tablet Take 1 tablet (20 mg total) by mouth 2 (two) times daily. 30 tablet Tarvares Lant K, PA-C   promethazine  (PHENERGAN ) 12.5 MG tablet Take 1 tablet (12.5 mg total) by mouth every 8 (eight) hours as needed for nausea or vomiting. 21 tablet Liam Bossman K, PA-C      PDMP not reviewed this encounter.   Budd Cargo, PA-C 11/22/23 2130    Dannika Hilgeman K, PA-C 11/22/23 0930

## 2023-11-22 NOTE — ED Triage Notes (Signed)
 Patient reports that she is having abdominal pain, emesis, chills x 4 days. Patient states, "My stomach is burning."  Patient states today, she is tolerating fluids,but not food. Patient states she has to sleep sitting up.

## 2023-11-22 NOTE — ED Triage Notes (Signed)
 Pt c/o N/V and abdominal pain x 3 days. Was seen at [facility] this AM, sent here due to increased lipase level.

## 2023-11-22 NOTE — Discharge Instructions (Addendum)
 I would like you to follow-up with a GI specialist soon as possible.  Call them to schedule an appointment.  We are going to start a lot of medication to help coat your stomach and deal with the acid reflux.  Take famotidine  twice daily, pantoprazole  daily on an empty stomach (30 minutes before meal or 2 hours after), Carafate before each meal and before bed.  Eat a bland diet avoid spicy/acidic/fatty foods.  Use promethazine  for nausea and vomiting.  This will make you sleepy so do not drive or drink alcohol with taking it.  Avoid alcohol as well as NSAIDs (aspirin, ibuprofen /Advil , naproxen /Aleve ).  If you have any worsening symptoms you need to go to the emergency room immediately.

## 2023-11-23 ENCOUNTER — Emergency Department (HOSPITAL_COMMUNITY)

## 2023-11-23 LAB — URINE CULTURE: Culture: NO GROWTH

## 2023-11-23 MED ORDER — IOHEXOL 350 MG/ML SOLN
75.0000 mL | Freq: Once | INTRAVENOUS | Status: AC | PRN
  Administered 2023-11-23: 75 mL via INTRAVENOUS

## 2023-11-23 MED ORDER — POTASSIUM CHLORIDE CRYS ER 20 MEQ PO TBCR
80.0000 meq | EXTENDED_RELEASE_TABLET | Freq: Once | ORAL | Status: AC
  Administered 2023-11-23: 80 meq via ORAL
  Filled 2023-11-23: qty 4

## 2023-11-23 NOTE — Discharge Instructions (Signed)
 Please take the medications as prescribed by urgent care earlier today. Your CT scan was reassuring. Follow up with your primary care provider and gastroenterologist for further management as needed. If you develop any life threatening symptoms please return to the emergency department.

## 2023-11-23 NOTE — ED Notes (Signed)
 Patient discharged in stable condition, education materials explained including, follow up, any prescriptions and reasons to return. Patient voiced agreement to education and discharge material.

## 2023-11-23 NOTE — ED Notes (Signed)
 Patient transported to CT

## 2023-11-23 NOTE — ED Provider Notes (Signed)
 [place] EMERGENCY DEPARTMENT AT [facility] Provider Note   CSN: [id number] Arrival date & time: 11/22/23  2006     History  Chief Complaint  Patient presents with   Emesis    Donna Cook is a 38 y.o. female.  Patient with past medical history significant for GERD, previously seen by GI presented to the emergency department complaining of several days of abdominal pain described as burning with epigastric pain, right lower quadrant pain, nausea, and vomiting.  She reports symptoms began approximately 2 to 3 days ago after eating [place] food.  She went to urgent care earlier today who recommended follow-up with gastroenterology.  After discharge urgent care called her back and stated her lipase was elevated and that she had a low potassium level and recommended further evaluation at the emergency department.  The patient tells me she has 8 out of 10 pain at this time described as burning.  She has used over-the-counter Tums, prescribed pantoprazole , and Zofran  with no relief in symptoms.  She is tolerating fluids but has not been eating due to recurrent nausea and vomiting.  Symptoms have reportedly worsened when laying flat at night.   Emesis      Home Medications Prior to Admission medications   Medication Sig Start Date End Date Taking? Authorizing Provider  famotidine  (PEPCID ) 20 MG tablet Take 1 tablet (20 mg total) by mouth 2 (two) times daily. 11/22/23   Raspet, Erin K, PA-C  ondansetron  (ZOFRAN ) 4 MG tablet Take 1 tablet (4 mg total) by mouth every 8 (eight) hours as needed for nausea or vomiting. 08/24/23   Carie Charity, PA-C  pantoprazole  (PROTONIX ) 40 MG tablet Take 1 tablet (40 mg total) by mouth daily. 11/22/23 12/22/23  Raspet, Erin K, PA-C  phentermine (ADIPEX-P) 37.5 MG tablet Take 37.5 mg by mouth every morning. 08/21/23   [provider]  promethazine  (PHENERGAN ) 12.5 MG tablet Take 1 tablet (12.5 mg total) by mouth every 8 (eight) hours as needed  for nausea or vomiting. 11/22/23   Raspet, Erin K, PA-C  sucralfate (CARAFATE) 1 g tablet Take 1 tablet (1 g total) by mouth 4 (four) times daily -  with meals and at bedtime. 11/22/23   Raspet, Erin K, PA-C      Allergies    Amoxicillin    Review of Systems   Review of Systems  Gastrointestinal:  Positive for vomiting.    Physical Exam Updated Vital Signs BP (!) 149/88   Pulse 75   Temp 98.3 F (36.8 C) (Oral)   Resp 17   Ht 5\' 9"  (1.753 m)   Wt 122.5 kg   LMP 10/30/2023 (Approximate)   SpO2 99%   BMI 39.87 kg/m  Physical Exam Vitals and nursing note reviewed.  Constitutional:      General: She is not in acute distress.    Appearance: She is well-developed. She is obese.  HENT:     Head: Normocephalic and atraumatic.  Eyes:     Conjunctiva/sclera: Conjunctivae normal.  Cardiovascular:     Rate and Rhythm: Normal rate and regular rhythm.  Pulmonary:     Effort: Pulmonary effort is normal. No respiratory distress.     Breath sounds: Normal breath sounds.  Abdominal:     Palpations: Abdomen is soft.     Tenderness: There is abdominal tenderness.     Comments: Generalized tenderness worse in the epigastric and right lower quadrant regions  Musculoskeletal:        General:  No swelling.     Cervical back: Neck supple.  Skin:    General: Skin is warm and dry.     Capillary Refill: Capillary refill takes less than 2 seconds.  Neurological:     Mental Status: She is alert.  Psychiatric:        Mood and Affect: Mood normal.     ED Results / Procedures / Treatments   Labs (all labs ordered are listed, but only abnormal results are displayed) Labs Reviewed  COMPREHENSIVE METABOLIC PANEL WITH GFR - Abnormal; Notable for the following components:      Result Value   Sodium 128 (*)    Potassium 3.2 (*)    Chloride 92 (*)    Glucose, Bld 135 (*)    All other components within normal limits  URINALYSIS, ROUTINE W REFLEX MICROSCOPIC - Abnormal; Notable for the  following components:   Color, Urine AMBER (*)    APPearance CLOUDY (*)    Protein, ur 100 (*)    Leukocytes,Ua LARGE (*)    Bacteria, UA RARE (*)    All other components within normal limits  MAGNESIUM   CBC WITH DIFFERENTIAL/PLATELET    EKG EKG Interpretation Date/Time:  Wednesday Nov 22 2023 20:39:19 EDT Ventricular Rate:  94 PR Interval:  120 QRS Duration:  86 QT Interval:  374 QTC Calculation: 467 R Axis:   -8  Text Interpretation: Normal sinus rhythm Normal ECG When compared with ECG of 04-Feb-2019 00:31, Premature atrial complexes are no longer present Confirmed by Alissa April ([id number]) on 11/22/2023 11:09:38 PM  Radiology CT ABDOMEN PELVIS W CONTRAST Result Date: 11/23/2023 CLINICAL DATA:  Epigastric abdominal pain, right lower quadrant abdominal pain EXAM: CT ABDOMEN AND PELVIS WITH CONTRAST TECHNIQUE: Multidetector CT imaging of the abdomen and pelvis was performed using the standard protocol following bolus administration of intravenous contrast. RADIATION DOSE REDUCTION: This exam was performed according to the departmental dose-optimization program which includes automated exposure control, adjustment of the mA and/or kV according to patient size and/or use of iterative reconstruction technique. CONTRAST:  75mL OMNIPAQUE  IOHEXOL  350 MG/ML SOLN COMPARISON:  None Available. FINDINGS: Lower chest: No acute abnormality. Hepatobiliary: No focal liver abnormality is seen. No gallstones, gallbladder wall thickening, or biliary dilatation. Pancreas: Unremarkable Spleen: Unremarkable Adrenals/Urinary Tract: Adrenal glands are unremarkable. Kidneys are normal, without renal calculi, focal lesion, or hydronephrosis. Bladder is unremarkable. Stomach/Bowel: Stomach is within normal limits. Appendix appears normal. No evidence of bowel wall thickening, distention, or inflammatory changes. Vascular/Lymphatic: No significant vascular findings are present. No enlarged abdominal or pelvic lymph  nodes. Reproductive: Uterus and bilateral adnexa are unremarkable. Other: No abdominal wall hernia or abnormality. No abdominopelvic ascites. Musculoskeletal: No acute or significant osseous findings. IMPRESSION: 1. No acute abdominal or pelvic findings. Normal appendix. Electronically Signed   By: Worthy Heads M.D.   On: 11/23/2023 01:58    Procedures Procedures    Medications Ordered in ED Medications  pantoprazole  (PROTONIX ) injection 40 mg (40 mg Intravenous Given 11/23/23 0020)  famotidine  (PEPCID ) IVPB 20 mg premix (0 mg Intravenous Stopped 11/23/23 0050)  iohexol  (OMNIPAQUE ) 350 MG/ML injection 75 mL (75 mLs Intravenous Contrast Given 11/23/23 0125)  potassium chloride  SA (KLOR-CON  M) CR tablet 80 mEq (80 mEq Oral Given 11/23/23 0141)    ED Course/ Medical Decision Making/ A&P                                 Medical  Decision Making Amount and/or Complexity of Data Reviewed Radiology: ordered.  Risk Prescription drug management.   This patient presents to the ED for concern of abdominal pain, this involves an extensive number of treatment options, and is a complaint that carries with it a high risk of complications and morbidity.  The differential diagnosis includes gastritis, peptic ulcer disease, pancreatitis, cholecystitis, appendicitis, others   Co morbidities that complicate the patient evaluation  History of GERD   Additional history obtained:   External records from outside source obtained and reviewed including urgent care notes   Lab Tests:  I Ordered, and personally interpreted labs.  The pertinent results include: Mild hypokalemia with a potassium of 3.2, lipase from outside lab was 58 (normal values up to 50), UA with large leukocytes, rare bacteria, greater than 50 WBC concerning for possible infection but patient denies any urinary symptoms   Imaging Studies ordered:  I ordered imaging studies including CT abdomen pelvis with contrast I independently  visualized and interpreted imaging which showed no acute findings I agree with the radiologist interpretation   Cardiac Monitoring: / EKG:  The patient was maintained on a cardiac monitor.  I personally viewed and interpreted the cardiac monitored which showed an underlying rhythm of: Normal sinus rhythm   Problem List / ED Course / Critical interventions / Medication management   I ordered medication including Pepcid  and Protonix  for reflux symptoms, potassium for hypokalemia Reevaluation of the patient after these medicines showed that the patient improved I have reviewed the patients home medicines and have made adjustments as needed  Social Determinants of Health:  Patient is a former smoker, has Medicaid for her primary health insurance   Test / Admission - Considered:  Patient with no acute findings on imaging.  Mildly elevated lipase but no evidence of pancreatitis on imaging.  She was mildly hypokalemic and was treated with oral potassium.  Patient has generalized abdominal discomfort.  She has a history of GERD.  Feel this is likely an exacerbation of her underlying GERD. She was able to drink water with the oral potassium with no difficulty.  I will send a prescription for Carafate to the patient's pharmacy. Upon chart review it was noted that a prescription for this was sent earlier today by urgent care.   She should follow-up with gastroenterology for further management as needed. No indication for further emergent workup or admission.          Final Clinical Impression(s) / ED Diagnoses Final diagnoses:  Epigastric pain  Hypokalemia    Rx / DC Orders ED Discharge Orders     None         Delories Fetter 11/23/23 0211    Alissa April, MD 11/23/23 [phone]

## 2024-01-04 ENCOUNTER — Other Ambulatory Visit (HOSPITAL_COMMUNITY)
Admission: RE | Admit: 2024-01-04 | Discharge: 2024-01-04 | Disposition: A | Discharge status: Home / Self Care | Payer: PRIVATE HEALTH INSURANCE | Source: Ambulatory Visit | Attending: Obstetrics and Gynecology | Admitting: Obstetrics and Gynecology

## 2024-01-04 ENCOUNTER — Encounter: Payer: Self-pay | Admitting: Obstetrics and Gynecology

## 2024-01-04 ENCOUNTER — Ambulatory Visit: Payer: PRIVATE HEALTH INSURANCE | Admitting: Obstetrics and Gynecology

## 2024-01-04 VITALS — BP 130/89 | HR 83 | Ht 71.0 in | Wt 277.2 lb

## 2024-01-04 DIAGNOSIS — Z0000 Encounter for general adult medical examination without abnormal findings: Secondary | ICD-10-CM

## 2024-01-04 DIAGNOSIS — I10 Essential (primary) hypertension: Secondary | ICD-10-CM | POA: Diagnosis not present

## 2024-01-04 DIAGNOSIS — Z113 Encounter for screening for infections with a predominantly sexual mode of transmission: Secondary | ICD-10-CM | POA: Insufficient documentation

## 2024-01-04 DIAGNOSIS — Z3169 Encounter for other general counseling and advice on procreation: Secondary | ICD-10-CM

## 2024-01-04 NOTE — Progress Notes (Signed)
   WELL-WOMAN PHYSICAL & PAP Patient name: Donna Cook MRN [id number]  Date of birth: 08/01/1985 Chief Complaint:   No chief complaint on file.  History of Present Illness:   Donna Cook is a 38 y.o. [id number] African American female being seen today for a routine well-woman exam.  Current complaints: needs pap updated, STI screening and discuss trying to conceive  PCP: Shelda Atlas, MD      does desire labs Patient's last menstrual period was 12/25/2023 (approximate). The current method of family planning is none.  Last pap over 2 yrs ago. Results were: normal Last mammogram: n/a. Family h/o breast cancer: unsure Last colonoscopy: n/a. Family h/o colorectal cancer: unsure Review of Systems:   Pertinent items are noted in HPI Denies any headaches, blurred vision, fatigue, shortness of breath, chest pain, abdominal pain, abnormal vaginal discharge/itching/odor/irritation, problems with periods, bowel movements, urination, or intercourse unless otherwise stated above. Pertinent History Reviewed:  Reviewed past medical,surgical, social and family history.  Reviewed problem list, medications and allergies. Physical Assessment:   Vitals:   01/04/24 1057  BP: 130/89  Pulse: 83  Weight: 277 lb 3.2 oz (125.7 kg)  Height: 5' 11 (1.803 m)  Body mass index is 38.66 kg/m.        Physical Examination:   General appearance - well appearing, and in no distress  Mental status - alert, oriented to person, place, and time  Psych:  She has a normal mood and affect  Skin - warm and dry, normal color, no suspicious lesions noted  Chest - effort normal, all lung fields clear to auscultation bilaterally  Heart - normal rate and regular rhythm  Neck:  midline trachea, no thyromegaly or nodules  Breasts - breasts appear normal, no suspicious masses, no skin or nipple changes or axillary nodes. Subdermal piercing in between breasts x 1 yr; no complications.  Abdomen - soft, nontender,  nondistended, no masses or organomegaly  Pelvic - VULVA: normal appearing vulva with no masses, tenderness or lesions VAGINA: normal appearing vagina with normal color and discharge, no lesions CERVIX: normal appearing cervix without discharge or lesions, no CMT  Thin prep pap is done with HR HPV cotesting  UTERUS: uterus is felt to be normal size, shape, consistency and nontender   ADNEXA: No adnexal masses or tenderness noted.  Rectal - deferred  Extremities:  No swelling or varicosities noted  No results found for this or any previous visit (from the past 24 hours).  Assessment & Plan:  1) Annual physical exam (Primary) - Cytology - PAP( [facility])  2) Screen for STD (sexually transmitted disease) - Cervicovaginal ancillary only( [facility]) - Hepatitis B Surface AntiGEN - Hepatitis C Antibody - HIV antibody (with reflex) - RPR  3. Pre-conception counseling - Advised it is best to get BP and weight under control prior to conceiving to optimize her ability to have a healthy pregnanncy   Labs/procedures today: pap  Mammogram at age 73 or sooner if problems Colonoscopy at age 54 or sooner if problems  Orders Placed This Encounter  Procedures   Hepatitis B Surface AntiGEN   Hepatitis C Antibody   HIV antibody (with reflex)   RPR    Meds: No orders of the defined types were placed in this encounter.   Follow-up: Return in about 1 year (around 01/03/2025), or if symptoms worsen or fail to improve, for Annual Exam or with (+) Pregnancy test.  Ala Cart MSN, CNM 01/04/2024 11:32 AM

## 2024-01-04 NOTE — Progress Notes (Signed)
 Not on any birth control, doesn't wish to be.   Pt states she hasn't had a PAP in over 2 years; states she isn't aware of any abnormal ones.   Would like to discuss fertility. First child delivered at 5 months (now 27) Was told at the time to wait at least two years before attempted to conceive again.   No other concerns at this time.

## 2024-01-05 LAB — HEPATITIS B SURFACE ANTIGEN: Hepatitis B Surface Ag: NEGATIVE

## 2024-01-05 LAB — RPR: RPR Ser Ql: NONREACTIVE

## 2024-01-05 LAB — HIV ANTIBODY (ROUTINE TESTING W REFLEX): HIV Screen 4th Generation wRfx: NONREACTIVE

## 2024-01-05 LAB — HEPATITIS C ANTIBODY: Hep C Virus Ab: NONREACTIVE

## 2024-01-08 ENCOUNTER — Ambulatory Visit: Payer: Self-pay | Admitting: Obstetrics and Gynecology

## 2024-01-08 DIAGNOSIS — B3731 Acute candidiasis of vulva and vagina: Secondary | ICD-10-CM

## 2024-01-08 DIAGNOSIS — N760 Acute vaginitis: Secondary | ICD-10-CM

## 2024-01-08 DIAGNOSIS — A599 Trichomoniasis, unspecified: Secondary | ICD-10-CM

## 2024-01-08 LAB — CERVICOVAGINAL ANCILLARY ONLY
Bacterial Vaginitis (gardnerella): POSITIVE — AB
Candida Glabrata: NEGATIVE
Candida Vaginitis: POSITIVE — AB
Chlamydia: NEGATIVE
Comment: NEGATIVE
Comment: NEGATIVE
Comment: NEGATIVE
Comment: NEGATIVE
Comment: NEGATIVE
Comment: NORMAL
Neisseria Gonorrhea: NEGATIVE
Trichomonas: POSITIVE — AB

## 2024-01-08 MED ORDER — METRONIDAZOLE 500 MG PO TABS: 500.0000 mg | ORAL_TABLET | Freq: Two times a day (BID) | ORAL | 0 refills | Status: DC

## 2024-01-08 MED ORDER — FLUCONAZOLE 150 MG PO TABS: 150.0000 mg | ORAL_TABLET | Freq: Once | ORAL | 0 refills | Status: AC

## 2024-01-12 LAB — CYTOLOGY - PAP
Comment: NEGATIVE
Diagnosis: NEGATIVE
High risk HPV: NEGATIVE

## 2024-02-20 ENCOUNTER — Emergency Department (HOSPITAL_COMMUNITY)
Admission: EM | Admit: 2024-02-20 | Discharge: 2024-02-20 | Disposition: A | Discharge status: Home / Self Care | Attending: Emergency Medicine | Admitting: Emergency Medicine

## 2024-02-20 ENCOUNTER — Other Ambulatory Visit: Payer: Self-pay

## 2024-02-20 ENCOUNTER — Encounter (HOSPITAL_COMMUNITY): Payer: Self-pay

## 2024-02-20 ENCOUNTER — Emergency Department (HOSPITAL_COMMUNITY)

## 2024-02-20 DIAGNOSIS — R112 Nausea with vomiting, unspecified: Secondary | ICD-10-CM | POA: Diagnosis present

## 2024-02-20 DIAGNOSIS — R109 Unspecified abdominal pain: Secondary | ICD-10-CM

## 2024-02-20 LAB — CBC WITH DIFFERENTIAL/PLATELET
Abs Immature Granulocytes: 0.02 K/uL (ref 0.00–0.07)
Basophils Absolute: 0 K/uL (ref 0.0–0.1)
Basophils Relative: 0 %
Eosinophils Absolute: 0 K/uL (ref 0.0–0.5)
Eosinophils Relative: 0 %
HCT: 43.8 % (ref 36.0–46.0)
Hemoglobin: 14.6 g/dL (ref 12.0–15.0)
Immature Granulocytes: 0 %
Lymphocytes Relative: 36 %
Lymphs Abs: 2.7 K/uL (ref 0.7–4.0)
MCH: 28.9 pg (ref 26.0–34.0)
MCHC: 33.3 g/dL (ref 30.0–36.0)
MCV: 86.6 fL (ref 80.0–100.0)
Monocytes Absolute: 1.1 K/uL — ABNORMAL HIGH (ref 0.1–1.0)
Monocytes Relative: 14 %
Neutro Abs: 3.7 K/uL (ref 1.7–7.7)
Neutrophils Relative %: 50 %
Platelets: 327 K/uL (ref 150–400)
RBC: 5.06 MIL/uL (ref 3.87–5.11)
RDW: 12.6 % (ref 11.5–15.5)
WBC: 7.5 K/uL (ref 4.0–10.5)
nRBC: 0 % (ref 0.0–0.2)

## 2024-02-20 LAB — COMPREHENSIVE METABOLIC PANEL WITH GFR
ALT: 37 U/L (ref 0–44)
AST: 38 U/L (ref 15–41)
Albumin: 4.3 g/dL (ref 3.5–5.0)
Alkaline Phosphatase: 65 U/L (ref 38–126)
Anion gap: 14 (ref 5–15)
BUN: 8 mg/dL (ref 6–20)
CO2: 25 mmol/L (ref 22–32)
Calcium: 9.8 mg/dL (ref 8.9–10.3)
Chloride: 93 mmol/L — ABNORMAL LOW (ref 98–111)
Creatinine, Ser: 0.92 mg/dL (ref 0.44–1.00)
GFR, Estimated: >60 mL/min (ref >60)
Glucose, Bld: 118 mg/dL — ABNORMAL HIGH (ref 70–99)
Potassium: 3.4 mmol/L — ABNORMAL LOW (ref 3.5–5.1)
Sodium: 132 mmol/L — ABNORMAL LOW (ref 135–145)
Total Bilirubin: 1.1 mg/dL (ref 0.0–1.2)
Total Protein: 8 g/dL (ref 6.5–8.1)

## 2024-02-20 LAB — URINALYSIS, ROUTINE W REFLEX MICROSCOPIC
Glucose, UA: NEGATIVE mg/dL
Hgb urine dipstick: NEGATIVE
Ketones, ur: NEGATIVE mg/dL
Leukocytes,Ua: NEGATIVE
Nitrite: NEGATIVE
Protein, ur: >=300 mg/dL — AB
Specific Gravity, Urine: 1.025 (ref 1.005–1.030)
pH: 5 (ref 5.0–8.0)

## 2024-02-20 LAB — LIPASE, BLOOD: Lipase: 30 U/L (ref 11–51)

## 2024-02-20 LAB — HCG, SERUM, QUALITATIVE: Preg, Serum: NEGATIVE

## 2024-02-20 MED ORDER — ONDANSETRON 8 MG PO TBDP: 8.0000 mg | ORAL_TABLET | Freq: Three times a day (TID) | ORAL | 0 refills | Status: DC | PRN

## 2024-02-20 MED ORDER — SODIUM CHLORIDE 0.9 % IV SOLN: INTRAVENOUS | Status: DC

## 2024-02-20 MED ORDER — DROPERIDOL 2.5 MG/ML IJ SOLN
2.5000 mg | Freq: Once | INTRAMUSCULAR | Status: AC
  Administered 2024-02-20: 2.5 mg via INTRAVENOUS
  Filled 2024-02-20: qty 2

## 2024-02-20 MED ORDER — SODIUM CHLORIDE 0.9 % IV BOLUS
1000.0000 mL | Freq: Once | INTRAVENOUS | Status: AC
  Administered 2024-02-20: 1000 mL via INTRAVENOUS

## 2024-02-20 NOTE — ED Provider Notes (Signed)
 [place] EMERGENCY DEPARTMENT AT [facility] Provider Note   CSN: [id number] Arrival date & time: 02/20/24  1652     Patient presents with: Emesis   Donna Cook is a 38 y.o. female.   38 year old female presents with nausea vomiting which has been going on for several days.  Patient states symptoms wax and wanes.  Have not been associated with fever or chills.  States that things started when she was trying to withhold bowel movement and then she states she threw up.  Notes that she is currently passing flatus.  Denies any prior history of bowel obstructions.  She does have history of a C-section in the past.  No urinary symptoms.  States symptoms also get somewhat worse at nighttime.       Prior to Admission medications   Medication Sig Start Date End Date Taking? Authorizing Provider  famotidine  (PEPCID ) 20 MG tablet Take 1 tablet (20 mg total) by mouth 2 (two) times daily. 11/22/23   Raspet, Erin K, PA-C  metroNIDAZOLE  (FLAGYL ) 500 MG tablet Take 1 tablet (500 mg total) by mouth 2 (two) times daily. 01/08/24   Dawson, Rolitta, CNM  ondansetron  (ZOFRAN ) 4 MG tablet Take 1 tablet (4 mg total) by mouth every 8 (eight) hours as needed for nausea or vomiting. 08/24/23   Francis Ileana SAILOR, PA-C  pantoprazole  (PROTONIX ) 40 MG tablet Take 1 tablet (40 mg total) by mouth daily. 11/22/23 01/04/24  Raspet, Erin K, PA-C  phentermine (ADIPEX-P) 37.5 MG tablet Take 37.5 mg by mouth every morning. 08/21/23   [provider]  promethazine  (PHENERGAN ) 12.5 MG tablet Take 1 tablet (12.5 mg total) by mouth every 8 (eight) hours as needed for nausea or vomiting. 11/22/23   Raspet, Erin K, PA-C  sucralfate  (CARAFATE ) 1 g tablet Take 1 tablet (1 g total) by mouth 4 (four) times daily -  with meals and at bedtime. 11/22/23   Raspet, Erin K, PA-C    Allergies: Amoxicillin    Review of Systems  All other systems reviewed and are negative.   Updated Vital Signs BP (!) 168/114 (BP Location:  Right Arm)   Pulse 98   Temp 99 F (37.2 C) (Oral)   Resp 17   SpO2 100%   Physical Exam Vitals and nursing note reviewed.  Constitutional:      General: She is not in acute distress.    Appearance: Normal appearance. She is well-developed. She is not toxic-appearing.  HENT:     Head: Normocephalic and atraumatic.  Eyes:     General: Lids are normal.     Conjunctiva/sclera: Conjunctivae normal.     Pupils: Pupils are equal, round, and reactive to light.  Neck:     Thyroid: No thyroid mass.     Trachea: No tracheal deviation.  Cardiovascular:     Rate and Rhythm: Normal rate and regular rhythm.     Heart sounds: Normal heart sounds. No murmur heard.    No gallop.  Pulmonary:     Effort: Pulmonary effort is normal. No respiratory distress.     Breath sounds: Normal breath sounds. No stridor. No decreased breath sounds, wheezing, rhonchi or rales.  Abdominal:     General: There is no distension.     Palpations: Abdomen is soft.     Tenderness: There is no abdominal tenderness. There is no rebound.  Musculoskeletal:        General: No tenderness. Normal range of motion.     Cervical back:  Normal range of motion and neck supple.  Skin:    General: Skin is warm and dry.     Findings: No abrasion or rash.  Neurological:     Mental Status: She is alert and oriented to person, place, and time. Mental status is at baseline.     GCS: GCS eye subscore is 4. GCS verbal subscore is 5. GCS motor subscore is 6.     Cranial Nerves: No cranial nerve deficit.     Sensory: No sensory deficit.     Motor: Motor function is intact.  Psychiatric:        Attention and Perception: Attention normal.        Speech: Speech normal.        Behavior: Behavior normal.     (all labs ordered are listed, but only abnormal results are displayed) Labs Reviewed  COMPREHENSIVE METABOLIC PANEL WITH GFR - Abnormal; Notable for the following components:      Result Value   Sodium 132 (*)    Potassium  3.4 (*)    Chloride 93 (*)    Glucose, Bld 118 (*)    All other components within normal limits  CBC WITH DIFFERENTIAL/PLATELET - Abnormal; Notable for the following components:   Monocytes Absolute 1.1 (*)    All other components within normal limits  URINALYSIS, ROUTINE W REFLEX MICROSCOPIC - Abnormal; Notable for the following components:   Color, Urine AMBER (*)    APPearance HAZY (*)    Bilirubin Urine SMALL (*)    Protein, ur >=300 (*)    Bacteria, UA RARE (*)    All other components within normal limits  LIPASE, BLOOD  HCG, SERUM, QUALITATIVE    EKG: None  Radiology: No results found.   Procedures   Medications Ordered in the ED  sodium chloride  0.9 % bolus 1,000 mL (has no administration in time range)  0.9 %  sodium chloride  infusion (has no administration in time range)  droperidol  (INAPSINE ) 2.5 MG/ML injection 2.5 mg (has no administration in time range)                                    Medical Decision Making Amount and/or Complexity of Data Reviewed Labs: ordered. Radiology: ordered.  Risk Prescription drug management.  Pricey test negative. Patient given IV fluids antiemetics and feels better.  Abdominal CT showed no acute findings.  Urinalysis appears to be contaminated. Patient monitored after treatment and feels better.  Will discharge home and give prescription for Zofran      Final diagnoses:  None    ED Discharge Orders     None          Dasie Faden, MD 02/20/24 2313

## 2024-02-20 NOTE — ED Triage Notes (Signed)
 Pt reports with vomiting since Thursday. Pt states that she initially had to have a bowel movement but she held it and that's when the vomiting started.

## 2024-07-03 ENCOUNTER — Ambulatory Visit: Payer: Self-pay

## 2024-07-03 VITALS — BP 130/84 | HR 97 | Ht 70.0 in | Wt 283.7 lb

## 2024-07-03 DIAGNOSIS — Z3201 Encounter for pregnancy test, result positive: Secondary | ICD-10-CM

## 2024-07-03 DIAGNOSIS — Z3200 Encounter for pregnancy test, result unknown: Secondary | ICD-10-CM

## 2024-07-03 LAB — POCT URINE PREGNANCY: Preg Test, Ur: POSITIVE — AB

## 2024-07-03 NOTE — Progress Notes (Signed)
 Donna Cook here for a UPT. Pt had a positive upt at home. LMP is 05/20/2024.     UPT in office Positive.    Reviewed medications and informed to start a PNV, if not already. Pt to follow up in 2 weeks for New OB intake visit.

## 2024-07-24 ENCOUNTER — Other Ambulatory Visit: Payer: Self-pay

## 2024-07-24 ENCOUNTER — Ambulatory Visit: Payer: Self-pay | Admitting: *Deleted

## 2024-07-24 VITALS — BP 120/77 | HR 84 | Wt 287.1 lb

## 2024-07-24 DIAGNOSIS — O0991 Supervision of high risk pregnancy, unspecified, first trimester: Secondary | ICD-10-CM | POA: Diagnosis not present

## 2024-07-24 DIAGNOSIS — O0990 Supervision of high risk pregnancy, unspecified, unspecified trimester: Secondary | ICD-10-CM

## 2024-07-24 DIAGNOSIS — Z3A09 9 weeks gestation of pregnancy: Secondary | ICD-10-CM | POA: Diagnosis not present

## 2024-07-24 DIAGNOSIS — O3680X0 Pregnancy with inconclusive fetal viability, not applicable or unspecified: Secondary | ICD-10-CM | POA: Diagnosis not present

## 2024-07-24 MED ORDER — BLOOD PRESSURE KIT DEVI: 1.0000 | 0 refills | Status: AC

## 2024-07-24 NOTE — Progress Notes (Signed)
 New OB Intake  I connected with Donna Cook  on 07/24/24 at  9:15 AM EST by In Person Visit and verified that I am speaking with the correct person using two identifiers. Nurse is located at [facility] and pt is located at [facility].  I discussed the limitations, risks, security and privacy concerns of performing an evaluation and management service by telephone and the availability of in person appointments. I also discussed with the patient that there may be a patient responsible charge related to this service. The patient expressed understanding and agreed to proceed.  I explained I am completing New OB Intake today. We discussed EDD of TBD based on TBD/ non viable today. Pt is [id number]. I reviewed her allergies, medications and Medical/Surgical/OB history.    Patient Active Problem List   Diagnosis Date Noted   Supervision of high risk pregnancy, antepartum 07/24/2024   Chronic hypertension 01/04/2024     Concerns addressed today  Delivery Plans Plans to deliver at [facility]. Discussed the nature of our practice with multiple providers including residents and students as well as female and female providers. Due to the size of the practice, the delivering provider may not be the same as those providing prenatal care.   Patient Not Asked interested in water birth.  MyChart/Babyscripts MyChart access verified. I explained pt will have some visits in office and some virtually. Babyscripts instructions given and order placed. Patient verifies receipt of registration text/e-mail. Account successfully created and app downloaded. If patient is a candidate for Optimized scheduling, add to sticky note.   Blood Pressure Cuff/Weight Scale Blood pressure cuff ordered for patient to pick-up from [employer]. Explained after first prenatal appt pt will check weekly and document in Babyscripts. Patient does not have weight scale; patient may purchase if they desire to track weight weekly in  Babyscripts.  Anatomy US  Explained first scheduled US  will be around 19 weeks. Anatomy US  scheduled for NA at NA/ non viable today.  Is patient a candidate for Babyscripts Optimization? No, due to Risk Factors.   First visit review I reviewed new OB appt with patient. Explained pt will be seen by TBD at first visit. Discussed [place] genetic screening with patient. Requests Panorama and Horizon.. Routine prenatal labs OB Urine collected at today's visit.   Last Pap Diagnosis  Date Value Ref Range Status  01/04/2024   Final   - Negative for intraepithelial lesion or malignancy (NILM)    Donna CHRISTELLA Ober, RN 07/24/2024  5:55 PM

## 2024-08-09 ENCOUNTER — Other Ambulatory Visit: Payer: Self-pay

## 2024-08-09 ENCOUNTER — Other Ambulatory Visit: Payer: Self-pay | Admitting: Obstetrics and Gynecology

## 2024-08-09 ENCOUNTER — Ambulatory Visit: Payer: Self-pay | Admitting: *Deleted

## 2024-08-09 VITALS — BP 129/84 | HR 79 | Wt 289.7 lb

## 2024-08-09 DIAGNOSIS — O3680X0 Pregnancy with inconclusive fetal viability, not applicable or unspecified: Secondary | ICD-10-CM

## 2024-08-09 DIAGNOSIS — O0990 Supervision of high risk pregnancy, unspecified, unspecified trimester: Secondary | ICD-10-CM

## 2024-08-09 DIAGNOSIS — O021 Missed abortion: Secondary | ICD-10-CM

## 2024-08-09 MED ORDER — IBUPROFEN 600 MG PO TABS: 600.0000 mg | ORAL_TABLET | Freq: Four times a day (QID) | ORAL | 3 refills | Status: AC | PRN

## 2024-08-09 MED ORDER — MISOPROSTOL 200 MCG PO TABS: ORAL_TABLET | ORAL | 1 refills | Status: AC

## 2024-08-09 MED ORDER — PRENATE PIXIE 10-0.6-0.4-200 MG PO CAPS: 1.0000 | ORAL_CAPSULE | Freq: Every day | ORAL | 11 refills | Status: AC

## 2024-08-09 NOTE — Progress Notes (Cosign Needed)
 Donna Cook presents for repeat OB limited US  for viability and dating. IUGS observed on today's scan. No YS or FP observed on today's scan. Previous scan showed IUGS and YS but no FP. Dr. Alger was consulted for suspected failed pregnancy. See provider's note.

## 2024-08-09 NOTE — Progress Notes (Signed)
 39 yo P0111 at [redacted]w[redacted]d by LMP presenting for follow up viability scan. Patient with ultrasound on 1/21 demonstrating an intrauterine gestational sac without fetal pole. Repeat scan today revealed an enlarged gestational sac without fetal pole which is consistent with a  failed pregnancy. Patient counseled on management options including expectant management, medical management with cytotec  or surgical management with D&E. All questions were explained and patient opted for medical management. Rx sent to her pharmacy. Precautions reviewed. Plan to follow up in the office in 2 weeks

## 2024-08-28 ENCOUNTER — Ambulatory Visit: Payer: Self-pay | Admitting: Obstetrics and Gynecology
